# Patient Record
Sex: Male | Born: 1955
Health system: Southern US, Community
[De-identification: ages and names within clinical notes are randomized; demographics above are authoritative.]

## PROBLEM LIST (undated history)

## (undated) ENCOUNTER — Emergency Department (HOSPITAL_BASED_OUTPATIENT_CLINIC_OR_DEPARTMENT_OTHER): Admission: EM | Payer: BC Managed Care – PPO | Source: Home / Self Care

## (undated) DIAGNOSIS — M1990 Unspecified osteoarthritis, unspecified site: Secondary | ICD-10-CM

## (undated) DIAGNOSIS — M869 Osteomyelitis, unspecified: Secondary | ICD-10-CM

## (undated) DIAGNOSIS — G14 Postpolio syndrome: Secondary | ICD-10-CM

## (undated) DIAGNOSIS — I10 Essential (primary) hypertension: Secondary | ICD-10-CM

## (undated) HISTORY — DX: Unspecified osteoarthritis, unspecified site: M19.90

## (undated) HISTORY — PX: WISDOM TOOTH EXTRACTION: SHX21

## (undated) HISTORY — DX: Postpolio syndrome: G14

## (undated) HISTORY — PX: BONE MARROW ASPIRATION: SHX1252

## (undated) HISTORY — DX: Osteomyelitis, unspecified: M86.9

## (undated) HISTORY — DX: Essential (primary) hypertension: I10

---
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
## 2000-12-17 ENCOUNTER — Emergency Department (HOSPITAL_COMMUNITY): Admission: EM | Admit: 2000-12-17 | Discharge: 2000-12-18 | Payer: Self-pay | Admitting: Emergency Medicine

## 2003-11-11 ENCOUNTER — Emergency Department (HOSPITAL_COMMUNITY): Admission: EM | Admit: 2003-11-11 | Discharge: 2003-11-11 | Payer: Self-pay | Admitting: Emergency Medicine

## 2005-05-04 ENCOUNTER — Emergency Department (HOSPITAL_COMMUNITY): Admission: EM | Admit: 2005-05-04 | Discharge: 2005-05-04 | Payer: Self-pay | Admitting: Emergency Medicine

## 2012-10-25 ENCOUNTER — Encounter: Payer: Self-pay | Admitting: Physician Assistant

## 2012-10-25 ENCOUNTER — Ambulatory Visit (INDEPENDENT_AMBULATORY_CARE_PROVIDER_SITE_OTHER): Payer: BC Managed Care – PPO | Admitting: Physician Assistant

## 2012-10-25 VITALS — BP 158/92 | HR 52 | Temp 98.4°F | Resp 16 | Ht 67.5 in | Wt 207.5 lb

## 2012-10-25 DIAGNOSIS — Z0000 Encounter for general adult medical examination without abnormal findings: Secondary | ICD-10-CM

## 2012-10-25 DIAGNOSIS — Z299 Encounter for prophylactic measures, unspecified: Secondary | ICD-10-CM | POA: Insufficient documentation

## 2012-10-25 DIAGNOSIS — B91 Sequelae of poliomyelitis: Secondary | ICD-10-CM

## 2012-10-25 DIAGNOSIS — I10 Essential (primary) hypertension: Secondary | ICD-10-CM

## 2012-10-25 DIAGNOSIS — G14 Postpolio syndrome: Secondary | ICD-10-CM | POA: Insufficient documentation

## 2012-10-25 MED ORDER — AMLODIPINE BESYLATE 5 MG PO TABS: 2.5000 mg | ORAL_TABLET | Freq: Every day | ORAL | Status: DC

## 2012-10-25 NOTE — Assessment & Plan Note (Signed)
Uncontrolled with max dose of Hyzaar.  DASH diet handout given.  Decrease caffeine and alcohol consumption.  Rx Amlodipine 2.5 mg.  Keep BP log.  Follow up in 2 weeks.

## 2012-10-25 NOTE — Progress Notes (Signed)
Patient ID: Mark Humphrey, male   DOB: 1955/05/12, 57 y.o.   MRN: [id number]  Patient presents to clinic today to establish care  Acute Concerns: No current concerns.  Chronic Issues: (1) Hypertension -- Patient endorses being on medication Hyzaar for 3 months. Denies chest pain, headache, lightheadedness, dizziness, syncope or palpitations.  Stress intensifies his blood pressure. Does endorse alcohol consumption, poor diet and lack of exercise.  Denies history of depression, but endorses history of mild, occasional anxiety.  (2) Anxiety -- occasional. Work-related.  Is able to control by removing himself from the situation.  Health Maintenance: Dental -- overdue. Vision -- overdue. Immunizations -- up-to-date; has had flu shot 2 weeks ago.  Colonoscopy -- 2007; denies abnormal findings.     Past Medical History  Diagnosis Date  . HTN (hypertension)   . Post-polio syndrome 1958    Right Leg  . Osteomyelitis of left leg   . Osteoarthritis     Right leg    No current outpatient prescriptions on file prior to visit.   No current facility-administered medications on file prior to visit.    No Known Allergies  Family History  Problem Relation Age of Onset  . Arthritis Father     Deceased  . Heart disease Mother     Deceased  . Hypertension Mother   . Lung cancer Mother   . Lung cancer Other     Aunts x3  . Healthy Sister     x1  . COPD Sister   . Healthy Brother     x2    History   Social History  . Marital Status: Single    Spouse Name: N/A    Number of Children: N/A  . Years of Education: N/A   Social History Main Topics  . Smoking status: Never Smoker   . Smokeless tobacco: Current User    Types: Chew  . Alcohol Use: 4.8 oz/week    8 Shots of liquor per week  . Drug Use: No  . Sexual Activity: None   Other Topics Concern  . None   Social History Narrative  . None   Review of Systems  Constitutional: Negative for fever and weight loss.  HENT:  Negative for ear pain, hearing loss and tinnitus.   Eyes: Negative for blurred vision, double vision and photophobia.  Respiratory: Negative for cough, shortness of breath and wheezing.   Cardiovascular: Negative for chest pain and palpitations.  Gastrointestinal: Positive for heartburn. Negative for nausea, vomiting, abdominal pain, diarrhea, constipation, blood in stool and melena.  Genitourinary: Negative for dysuria, urgency, frequency, hematuria and flank pain.  Neurological: Negative for dizziness, seizures, loss of consciousness and headaches.  Endo/Heme/Allergies: Negative for environmental allergies.  Psychiatric/Behavioral: Negative for depression. The patient is not nervous/anxious.    Filed Vitals:   10/25/12 1725 10/25/12 1744  BP: 158/96 158/92  Pulse: 52   Temp: 98.4 F (36.9 C)   TempSrc: Oral   Resp: 16   Height: 5' 7.5" (1.715 m)   Weight: 207 lb 8 oz (94.121 kg)   SpO2: 98%     Physical Exam  Vitals reviewed. Constitutional: He is oriented to person, place, and time and well-developed, well-nourished, and in no distress.  HENT:  Head: Normocephalic and atraumatic.  Right Ear: External ear normal.  Left Ear: External ear normal.  Nose: Nose normal.  Mouth/Throat: Oropharynx is clear and moist. No oropharyngeal exudate.  TM WNL bilaterally  Eyes: Conjunctivae and EOM are normal. Pupils are equal,  round, and reactive to light.  Neck: Neck supple. No thyromegaly present.  Cardiovascular: Normal rate, regular rhythm, normal heart sounds and intact distal pulses.   Pulmonary/Chest: Effort normal and breath sounds normal. No respiratory distress. He has no wheezes. He has no rales. He exhibits no tenderness.  Abdominal: Soft. Bowel sounds are normal. He exhibits no distension and no mass. There is no tenderness. There is no rebound and no guarding.  Musculoskeletal:  RLE immobilized with brace due to post-polio syndrome.  Lymphadenopathy:    He has no cervical  adenopathy.  Neurological: He is alert and oriented to person, place, and time. No cranial nerve deficit.  Skin: Skin is warm and dry. No rash noted.  Psychiatric: Affect normal.   No results found for this or any previous visit (from the past 2160 hour(s)).  Assessment/Plan: Visit for preventive health examination Will obtain records from previous PCP.  Patient already received flu shot 2 weeks ago.  Colonoscopy due in 2017.  Will hold off on labs until records are reviewed due to patient endorsing just having labs drawn by previous PCP  Hypertension Uncontrolled with max dose of Hyzaar.  DASH diet handout given.  Decrease caffeine and alcohol consumption.  Rx Amlodipine 2.5 mg.  Keep BP log.  Follow up in 2 weeks.

## 2012-10-25 NOTE — Patient Instructions (Signed)
Please take medications as prescribed.  Check BP daily and write down to bring to next visit.  I would like to see you in 2 weeks.  Please read information below on DASH diet.  DASH Diet The DASH diet stands for "Dietary Approaches to Stop Hypertension." It is a healthy eating plan that has been shown to reduce high blood pressure (hypertension) in as little as 14 days, while also possibly providing other significant health benefits. These other health benefits include reducing the risk of breast cancer after menopause and reducing the risk of type 2 diabetes, heart disease, colon cancer, and stroke. Health benefits also include weight loss and slowing kidney failure in patients with chronic kidney disease.  DIET GUIDELINES  Limit salt (sodium). Your diet should contain less than 1500 mg of sodium daily.  Limit refined or processed carbohydrates. Your diet should include mostly whole grains. Desserts and added sugars should be used sparingly.  Include small amounts of heart-healthy fats. These types of fats include nuts, oils, and tub margarine. Limit saturated and trans fats. These fats have been shown to be harmful in the body. CHOOSING FOODS  The following food groups are based on a 2000 calorie diet. See your Registered Dietitian for individual calorie needs. Grains and Grain Products (6 to 8 servings daily)  Eat More Often: Whole-wheat bread, brown rice, whole-grain or wheat pasta, quinoa, popcorn without added fat or salt (air popped).  Eat Less Often: White bread, white pasta, white rice, cornbread. Vegetables (4 to 5 servings daily)  Eat More Often: Fresh, frozen, and canned vegetables. Vegetables may be raw, steamed, roasted, or grilled with a minimal amount of fat.  Eat Less Often/Avoid: Creamed or fried vegetables. Vegetables in a cheese sauce. Fruit (4 to 5 servings daily)  Eat More Often: All fresh, canned (in natural juice), or frozen fruits. Dried fruits without added sugar.  One hundred percent fruit juice ( cup [237 mL] daily).  Eat Less Often: Dried fruits with added sugar. Canned fruit in light or heavy syrup. [employer], Fish, and Poultry (2 servings or less daily. One serving is 3 to 4 oz [85-114 g]).  Eat More Often: Ninety percent or leaner ground beef, tenderloin, sirloin. Round cuts of beef, chicken breast, [place] breast. All fish. Grill, bake, or broil your meat. Nothing should be fried.  Eat Less Often/Avoid: Fatty cuts of meat, [place], or chicken leg, thigh, or wing. Fried cuts of meat or fish. Dairy (2 to 3 servings)  Eat More Often: Low-fat or fat-free milk, low-fat plain or light yogurt, reduced-fat or part-skim cheese.  Eat Less Often/Avoid: Milk (whole, 2%).Whole milk yogurt. Full-fat cheeses. Nuts, Seeds, and Legumes (4 to 5 servings per week)  Eat More Often: All without added salt.  Eat Less Often/Avoid: Salted nuts and seeds, canned beans with added salt. Fats and Sweets (limited)  Eat More Often: Vegetable oils, tub margarines without trans fats, sugar-free gelatin. Mayonnaise and salad dressings.  Eat Less Often/Avoid: Coconut oils, palm oils, butter, stick margarine, cream, half and half, cookies, candy, pie. FOR MORE INFORMATION The Dash Diet Eating Plan: www.dashdiet.org Document Released: 12/17/2010 Document Revised: 03/22/2011 Document Reviewed: 12/17/2010 [facility] Patient Information 2014 [place], [place].

## 2012-10-25 NOTE — Assessment & Plan Note (Signed)
Will obtain records from previous PCP.  Patient already received flu shot 2 weeks ago.  Colonoscopy due in 2017.  Will hold off on labs until records are reviewed due to patient endorsing just having labs drawn by previous PCP

## 2012-11-08 ENCOUNTER — Ambulatory Visit (INDEPENDENT_AMBULATORY_CARE_PROVIDER_SITE_OTHER): Payer: BC Managed Care – PPO | Admitting: Physician Assistant

## 2012-11-08 ENCOUNTER — Encounter: Payer: Self-pay | Admitting: Physician Assistant

## 2012-11-08 VITALS — BP 160/78 | HR 64 | Temp 98.7°F | Resp 16 | Ht 67.5 in | Wt 206.0 lb

## 2012-11-08 DIAGNOSIS — I10 Essential (primary) hypertension: Secondary | ICD-10-CM

## 2012-11-08 DIAGNOSIS — I340 Nonrheumatic mitral (valve) insufficiency: Secondary | ICD-10-CM | POA: Insufficient documentation

## 2012-11-08 DIAGNOSIS — R011 Cardiac murmur, unspecified: Secondary | ICD-10-CM

## 2012-11-08 NOTE — Assessment & Plan Note (Addendum)
Increase Norvasc to 5 mg (1 tablet) daily.  Continue with lifestyle changes and BP monitoring.  EKG obtained revealing Sinus Rhythm at rate of 60 bpm.  Follow-up in 2 weeks.

## 2012-11-08 NOTE — Assessment & Plan Note (Signed)
Grade II/VI systolic murmur heard best at RUSB.  Will obtain echocardiogram.  Scheduled for 1 week from now.  Will call patient with results.

## 2012-11-08 NOTE — Progress Notes (Signed)
Patient ID: Mark Humphrey, male   DOB: 16-Nov-1955, 57 y.o.   MRN: [id number]  Patient presents to clinic today for follow-up of hypertension.  At last visit patient was started on amlodipine 2.5 mg daily.  Patient was already on Hyzaar 100-25 mg tablet daily.  Discusses lifestyle changes including DASH diet, weight loss, exercise and decrease in alcohol consumption.  Patient endorses taking medication as prescribed.  Patient has been checking BP daily and has brought his machine to clinic today for BP measurement review.  Morning BP before medications are still within stage 2 hypertensive range.  However afternoon BP measurements are in the 130-140/80s.  Patient states he has started to improve his diet.  Still denies chest pain, palpitations, LH, dizziness, syncope or change in vision.  No EKG recorded in chart.     Past Medical History  Diagnosis Date  . HTN (hypertension)   . Post-polio syndrome 1958    Right Leg  . Osteomyelitis of left leg   . Osteoarthritis     Right leg    Current Outpatient Prescriptions on File Prior to Visit  Medication Sig Dispense Refill  . amLODipine (NORVASC) 5 MG tablet Take 0.5 tablets (2.5 mg total) by mouth daily.  90 tablet  3  . losartan-hydrochlorothiazide (HYZAAR) 100-25 MG per tablet Take 1 tablet by mouth daily.       No current facility-administered medications on file prior to visit.    No Known Allergies  Family History  Problem Relation Age of Onset  . Arthritis Father     Deceased  . Heart disease Mother     Deceased  . Hypertension Mother   . Lung cancer Mother   . Lung cancer Other     Aunts x3  . Healthy Sister     x1  . COPD Sister   . Healthy Brother     x2    History   Social History  . Marital Status: Single    Spouse Name: N/A    Number of Children: N/A  . Years of Education: N/A   Social History Main Topics  . Smoking status: Never Smoker   . Smokeless tobacco: Current User    Types: Chew  . Alcohol Use: 4.8  oz/week    8 Shots of liquor per week  . Drug Use: No  . Sexual Activity: None   Other Topics Concern  . None   Social History Narrative  . None   ROS See HPI.  All other ROS are negative.  Filed Vitals:   11/08/12 0722  BP: 160/78  Pulse: 64  Temp: 98.7 F (37.1 C)  Resp: 16    Physical Exam  Vitals reviewed. Constitutional: He is oriented to person, place, and time and well-developed, well-nourished, and in no distress.  HENT:  Head: Normocephalic and atraumatic.  Eyes: Conjunctivae are normal. Pupils are equal, round, and reactive to light.  Neck: Neck supple.  Cardiovascular: Normal rate, regular rhythm and intact distal pulses.   Murmur heard.  Systolic murmur is present with a grade of 2/6  Pulmonary/Chest: Effort normal and breath sounds normal. No respiratory distress. He has no wheezes. He has no rales. He exhibits no tenderness.  Lymphadenopathy:    He has no cervical adenopathy.  Neurological: He is alert and oriented to person, place, and time. No cranial nerve deficit.  Skin: Skin is warm and dry. No rash noted.  Psychiatric: Affect normal.   Assessment/Plan: Hypertension Increase Norvasc to 5 mg (1  tablet) daily.  Continue with lifestyle changes and BP monitoring.  EKG obtained revealing Sinus Rhythm at rate of 60 bpm.  Follow-up in 2 weeks.  Undiagnosed cardiac murmurs Grade II/VI systolic murmur heard best at RUSB.  Will obtain echocardiogram.  Scheduled for 1 week from now.  Will call patient with results.

## 2012-11-08 NOTE — Patient Instructions (Signed)
Increase your amlodipine (Norvasc) to 1 tablet a day.  Continue to keep implementing changes to your diet.  Continue monitoring BP at home.  I want to see you in 2 weeks  (preferably afternoon appointment).  You will be contacted by the imaging department for a date/time for your echocardiogram.  I will call you with all of your results.

## 2012-11-15 ENCOUNTER — Ambulatory Visit (HOSPITAL_BASED_OUTPATIENT_CLINIC_OR_DEPARTMENT_OTHER): Payer: BC Managed Care – PPO

## 2012-11-16 ENCOUNTER — Encounter: Payer: Self-pay | Admitting: *Deleted

## 2012-11-16 ENCOUNTER — Ambulatory Visit: Payer: BC Managed Care – PPO | Admitting: Physician Assistant

## 2012-11-20 ENCOUNTER — Ambulatory Visit (HOSPITAL_COMMUNITY)
Admission: RE | Admit: 2012-11-20 | Discharge: 2012-11-20 | Disposition: A | Discharge status: Home / Self Care | Payer: BC Managed Care – PPO | Source: Ambulatory Visit | Attending: Physician Assistant | Admitting: Physician Assistant

## 2012-11-20 DIAGNOSIS — I10 Essential (primary) hypertension: Secondary | ICD-10-CM | POA: Insufficient documentation

## 2012-11-20 DIAGNOSIS — I059 Rheumatic mitral valve disease, unspecified: Secondary | ICD-10-CM | POA: Insufficient documentation

## 2012-11-20 DIAGNOSIS — R011 Cardiac murmur, unspecified: Secondary | ICD-10-CM

## 2012-11-20 NOTE — Progress Notes (Signed)
Echocardiogram 2D Echocardiogram has been performed.  Mark Humphrey 11/20/2012, 11:46 AM 

## 2012-11-21 ENCOUNTER — Ambulatory Visit (HOSPITAL_COMMUNITY): Payer: BC Managed Care – PPO

## 2012-11-22 ENCOUNTER — Ambulatory Visit (INDEPENDENT_AMBULATORY_CARE_PROVIDER_SITE_OTHER): Payer: BC Managed Care – PPO | Admitting: Physician Assistant

## 2012-11-22 ENCOUNTER — Encounter: Payer: Self-pay | Admitting: Physician Assistant

## 2012-11-22 VITALS — BP 132/96 | HR 60 | Temp 98.9°F | Ht 67.5 in | Wt 204.0 lb

## 2012-11-22 DIAGNOSIS — I059 Rheumatic mitral valve disease, unspecified: Secondary | ICD-10-CM

## 2012-11-22 DIAGNOSIS — I10 Essential (primary) hypertension: Secondary | ICD-10-CM

## 2012-11-22 DIAGNOSIS — I519 Heart disease, unspecified: Secondary | ICD-10-CM

## 2012-11-22 DIAGNOSIS — I340 Nonrheumatic mitral (valve) insufficiency: Secondary | ICD-10-CM

## 2012-11-22 MED ORDER — LOSARTAN POTASSIUM-HCTZ 100-25 MG PO TABS: 1.0000 | ORAL_TABLET | Freq: Every day | ORAL | Status: DC

## 2012-11-22 NOTE — Assessment & Plan Note (Signed)
Discussed possibility of increasing amlodipine to 10 mg QD.  Patient wishes to stay at 5 mg and try a real attempt at dietary and lifestyle changes.  Given improvement in systolic BP and mild improvement in diastolic BP, I am amenable of a 43-month trial of TLCs.  Information on DASH diet and exercise/weight loss options given to and discussed with patient.

## 2012-11-22 NOTE — Assessment & Plan Note (Signed)
Echo 11/2012.  Patient to continue current anti-hypertensive medications.

## 2012-11-22 NOTE — Patient Instructions (Signed)
Please continue medications.  Read information below of lifestyle changes for blood pressure.  Follow-up in 1 month.  DASH Diet The DASH diet stands for "Dietary Approaches to Stop Hypertension." It is a healthy eating plan that has been shown to reduce high blood pressure (hypertension) in as little as 14 days, while also possibly providing other significant health benefits. These other health benefits include reducing the risk of breast cancer after menopause and reducing the risk of type 2 diabetes, heart disease, colon cancer, and stroke. Health benefits also include weight loss and slowing kidney failure in patients with chronic kidney disease.  DIET GUIDELINES  Limit salt (sodium). Your diet should contain less than 1500 mg of sodium daily.  Limit refined or processed carbohydrates. Your diet should include mostly whole grains. Desserts and added sugars should be used sparingly.  Include small amounts of heart-healthy fats. These types of fats include nuts, oils, and tub margarine. Limit saturated and trans fats. These fats have been shown to be harmful in the body. CHOOSING FOODS  The following food groups are based on a 2000 calorie diet. See your Registered Dietitian for individual calorie needs. Grains and Grain Products (6 to 8 servings daily)  Eat More Often: Whole-wheat bread, brown rice, whole-grain or wheat pasta, quinoa, popcorn without added fat or salt (air popped).  Eat Less Often: White bread, white pasta, white rice, cornbread. Vegetables (4 to 5 servings daily)  Eat More Often: Fresh, frozen, and canned vegetables. Vegetables may be raw, steamed, roasted, or grilled with a minimal amount of fat.  Eat Less Often/Avoid: Creamed or fried vegetables. Vegetables in a cheese sauce. Fruit (4 to 5 servings daily)  Eat More Often: All fresh, canned (in natural juice), or frozen fruits. Dried fruits without added sugar. One hundred percent fruit juice ( cup [237 mL]  daily).  Eat Less Often: Dried fruits with added sugar. Canned fruit in light or heavy syrup. [employer], Fish, and Poultry (2 servings or less daily. One serving is 3 to 4 oz [85-114 g]).  Eat More Often: Ninety percent or leaner ground beef, tenderloin, sirloin. Round cuts of beef, chicken breast, [place] breast. All fish. Grill, bake, or broil your meat. Nothing should be fried.  Eat Less Often/Avoid: Fatty cuts of meat, [place], or chicken leg, thigh, or wing. Fried cuts of meat or fish. Dairy (2 to 3 servings)  Eat More Often: Low-fat or fat-free milk, low-fat plain or light yogurt, reduced-fat or part-skim cheese.  Eat Less Often/Avoid: Milk (whole, 2%).Whole milk yogurt. Full-fat cheeses. Nuts, Seeds, and Legumes (4 to 5 servings per week)  Eat More Often: All without added salt.  Eat Less Often/Avoid: Salted nuts and seeds, canned beans with added salt. Fats and Sweets (limited)  Eat More Often: Vegetable oils, tub margarines without trans fats, sugar-free gelatin. Mayonnaise and salad dressings.  Eat Less Often/Avoid: Coconut oils, palm oils, butter, stick margarine, cream, half and half, cookies, candy, pie. FOR MORE INFORMATION The Dash Diet Eating Plan: www.dashdiet.org Document Released: 12/17/2010 Document Revised: 03/22/2011 Document Reviewed: 12/17/2010 [facility] Patient Information 2014 [place], [place].  Exercise to Lose Weight Exercise and a healthy diet may help you lose weight. Your doctor may suggest specific exercises. EXERCISE IDEAS AND TIPS  Choose low-cost things you enjoy doing, such as walking, bicycling, or exercising to workout videos.  Take stairs instead of the elevator.  Walk during your lunch break.  Park your car further away from work or school.  Go to a gym  or an exercise class.  Start with 5 to 10 minutes of exercise each day. Build up to 30 minutes of exercise 4 to 6 days a week.  Wear shoes with good support and comfortable  clothes.  Stretch before and after working out.  Work out until you breathe harder and your heart beats faster.  Drink extra water when you exercise.  Do not do so much that you hurt yourself, feel dizzy, or get very short of breath. Exercises that burn about 150 calories:  Running 1  miles in 15 minutes.  Playing volleyball for 45 to 60 minutes.  Washing and waxing a car for 45 to 60 minutes.  Playing touch football for 45 minutes.  Walking 1  miles in 35 minutes.  Pushing a stroller 1  miles in 30 minutes.  Playing basketball for 30 minutes.  Raking leaves for 30 minutes.  Bicycling 5 miles in 30 minutes.  Walking 2 miles in 30 minutes.  Dancing for 30 minutes.  Shoveling snow for 15 minutes.  Swimming laps for 20 minutes.  Walking up stairs for 15 minutes.  Bicycling 4 miles in 15 minutes.  Gardening for 30 to 45 minutes.  Jumping rope for 15 minutes.  Washing windows or floors for 45 to 60 minutes. Document Released: 01/30/2010 Document Revised: 03/22/2011 Document Reviewed: 01/30/2010 [facility] Patient Information 2014 [place], [place].

## 2012-11-22 NOTE — Progress Notes (Signed)
Pre visit review using our clinic review tool, if applicable. No additional management support is needed unless otherwise documented below in the visit note. 

## 2012-11-22 NOTE — Assessment & Plan Note (Signed)
Diagnosed via Echocardiogram 11/2012.  No further workup needed at present.  BP is being controlled with medications

## 2012-11-22 NOTE — Progress Notes (Signed)
Patient ID: Mark Humphrey, male   DOB: [birth year], 57 y.o.   MRN: [id number]  Patient presents to clinic today for follow-up of HTN and to discuss Echocardiogram results.  Patients BP is improved since increase of amlodipine to 5 mg qd.  Patient still taking Hyzaar as prescribed.   Denies chest pain, palpitations, shortness of breath, LH or dizziness.  Discussed results of echocardiogram with patient.  Murmur is coming from mild mitral regurgitation.  No systolic dysfunction.  Grade I diastolic dysfunction noted.  Patient already on appropriate therapy.   Past Medical History  Diagnosis Date  . HTN (hypertension)   . Post-polio syndrome 1958    Right Leg  . Osteomyelitis of left leg   . Osteoarthritis     Right leg    Current Outpatient Prescriptions on File Prior to Visit  Medication Sig Dispense Refill  . amLODipine (NORVASC) 5 MG tablet Take 0.5 tablets (2.5 mg total) by mouth daily.  90 tablet  3   No current facility-administered medications on file prior to visit.    No Known Allergies  Family History  Problem Relation Age of Onset  . Arthritis Father     Deceased  . Heart disease Mother     Deceased  . Hypertension Mother   . Lung cancer Mother   . Lung cancer Other     Aunts x3  . Healthy Sister     x1  . COPD Sister   . Healthy Brother     x2    History   Social History  . Marital Status: Single    Spouse Name: N/A    Number of Children: N/A  . Years of Education: N/A   Social History Main Topics  . Smoking status: Never Smoker   . Smokeless tobacco: Current User    Types: Chew  . Alcohol Use: 4.8 oz/week    8 Shots of liquor per week  . Drug Use: No  . Sexual Activity: None   Other Topics Concern  . None   Social History Narrative  . None   ROS See HPI.  All other ROS are negative.   Filed Vitals:   11/22/12 1623  BP: 132/96  Pulse: 60  Temp: 98.9 F (37.2 C)   Physical Exam  Vitals reviewed. Constitutional: He is oriented to  person, place, and time and well-developed, well-nourished, and in no distress.  HENT:  Head: Normocephalic and atraumatic.  Eyes: Conjunctivae are normal.  Neck: Neck supple.  Cardiovascular: Normal rate, regular rhythm, normal heart sounds and intact distal pulses.   Pulmonary/Chest: Effort normal and breath sounds normal. No respiratory distress. He has no wheezes. He has no rales. He exhibits no tenderness.  Neurological: He is alert and oriented to person, place, and time.  Skin: Skin is warm and dry. No rash noted.   No results found for this or any previous visit (from the past 2160 hour(s)).  Assessment/Plan: Mild diastolic dysfunction Echo 11/2012.  Patient to continue current anti-hypertensive medications.  Mild mitral regurgitation Diagnosed via Echocardiogram 11/2012.  No further workup needed at present.  BP is being controlled with medications  Hypertension Discussed possibility of increasing amlodipine to 10 mg QD.  Patient wishes to stay at 5 mg and try a real attempt at dietary and lifestyle changes.  Given improvement in systolic BP and mild improvement in diastolic BP, I am amenable of a 46-month trial of TLCs.  Information on DASH diet and exercise/weight loss options given to and  discussed with patient.

## 2012-12-25 ENCOUNTER — Encounter: Payer: Self-pay | Admitting: Physician Assistant

## 2012-12-25 ENCOUNTER — Ambulatory Visit (INDEPENDENT_AMBULATORY_CARE_PROVIDER_SITE_OTHER): Payer: BC Managed Care – PPO | Admitting: Physician Assistant

## 2012-12-25 VITALS — BP 138/88 | HR 72 | Temp 98.7°F | Resp 16 | Wt 207.0 lb

## 2012-12-25 DIAGNOSIS — I10 Essential (primary) hypertension: Secondary | ICD-10-CM

## 2012-12-25 NOTE — Progress Notes (Signed)
Patient ID: Mark Humphrey, male   DOB: 1955/12/21, 57 y.o.   MRN: [id number]  Patient presents to clinic today for 8-month follow-up for hypertension.  Patient's BP today at 138/88, mildly improved diastolic blood pressure when compared to last visit.  Patient has been checking BP at home and has continued to get  DBP in the 90s.  Denies chest pain, palpitations, LH, vision changes or syncope.   Past Medical History  Diagnosis Date  . HTN (hypertension)   . Post-polio syndrome 1958    Right Leg  . Osteomyelitis of left leg   . Osteoarthritis     Right leg    Current Outpatient Prescriptions on File Prior to Visit  Medication Sig Dispense Refill  . amLODipine (NORVASC) 5 MG tablet Take 0.5 tablets (2.5 mg total) by mouth daily.  90 tablet  3  . losartan-hydrochlorothiazide (HYZAAR) 100-25 MG per tablet Take 1 tablet by mouth daily.  30 tablet  3   No current facility-administered medications on file prior to visit.    No Known Allergies  Family History  Problem Relation Age of Onset  . Arthritis Father     Deceased  . Heart disease Mother     Deceased  . Hypertension Mother   . Lung cancer Mother   . Lung cancer Other     Aunts x3  . Healthy Sister     x1  . COPD Sister   . Healthy Brother     x2    History   Social History  . Marital Status: Single    Spouse Name: N/A    Number of Children: N/A  . Years of Education: N/A   Social History Main Topics  . Smoking status: Never Smoker   . Smokeless tobacco: Current User    Types: Chew  . Alcohol Use: 4.8 oz/week    8 Shots of liquor per week  . Drug Use: No  . Sexual Activity: None   Other Topics Concern  . None   Social History Narrative  . None   Review of Systems - See HPI.  All other ROS are negative.  Filed Vitals:   12/25/12 0715  BP: 138/88  Pulse: 72  Temp: 98.7 F (37.1 C)  Resp: 16   Physical Exam  Vitals reviewed. Constitutional: He is oriented to person, place, and time and  well-developed, well-nourished, and in no distress.  HENT:  Head: Normocephalic and atraumatic.  Eyes: Conjunctivae and EOM are normal.  Neck: Neck supple.  Cardiovascular: Normal rate, regular rhythm and normal heart sounds.   Pulmonary/Chest: Effort normal and breath sounds normal. No respiratory distress. He has no wheezes. He has no rales. He exhibits no tenderness.  Lymphadenopathy:    He has no cervical adenopathy.  Neurological: He is alert and oriented to person, place, and time.  Skin: Skin is warm and dry.  Psychiatric: Affect normal.   Assessment/Plan: Hypertension Increase amlodipine to 10 mg daily.  Continue DASH diet and BP monitoring.  Return to clinic in 2 weeks.

## 2012-12-25 NOTE — Patient Instructions (Signed)
Please increase Norvasc to 2 tablets a day (10 mg).  Continue to monitor BP daily.  Please return to clinic in 2 weeks.  Continue with DASH diet.  Hypertension As your heart beats, it forces blood through your arteries. This force is your blood pressure. If the pressure is too high, it is called hypertension (HTN) or high blood pressure. HTN is dangerous because you may have it and not know it. High blood pressure may mean that your heart has to work harder to pump blood. Your arteries may be narrow or stiff. The extra work puts you at risk for heart disease, stroke, and other problems.  Blood pressure consists of two numbers, a higher number over a lower, 110/72, for example. It is stated as "110 over 72." The ideal is below 120 for the top number (systolic) and under 80 for the bottom (diastolic). Write down your blood pressure today. You should pay close attention to your blood pressure if you have certain conditions such as:  Heart failure.  Prior heart attack.  Diabetes  Chronic kidney disease.  Prior stroke.  Multiple risk factors for heart disease. To see if you have HTN, your blood pressure should be measured while you are seated with your arm held at the level of the heart. It should be measured at least twice. A one-time elevated blood pressure reading (especially in the Emergency Department) does not mean that you need treatment. There may be conditions in which the blood pressure is different between your right and left arms. It is important to see your caregiver soon for a recheck. Most people have essential hypertension which means that there is not a specific cause. This type of high blood pressure may be lowered by changing lifestyle factors such as:  Stress.  Smoking.  Lack of exercise.  Excessive weight.  Drug/tobacco/alcohol use.  Eating less salt. Most people do not have symptoms from high blood pressure until it has caused damage to the body. Effective treatment  can often prevent, delay or reduce that damage. TREATMENT  When a cause has been identified, treatment for high blood pressure is directed at the cause. There are a large number of medications to treat HTN. These fall into several categories, and your caregiver will help you select the medicines that are best for you. Medications may have side effects. You should review side effects with your caregiver. If your blood pressure stays high after you have made lifestyle changes or started on medicines,   Your medication(s) may need to be changed.  Other problems may need to be addressed.  Be certain you understand your prescriptions, and know how and when to take your medicine.  Be sure to follow up with your caregiver within the time frame advised (usually within two weeks) to have your blood pressure rechecked and to review your medications.  If you are taking more than one medicine to lower your blood pressure, make sure you know how and at what times they should be taken. Taking two medicines at the same time can result in blood pressure that is too low. SEEK IMMEDIATE MEDICAL CARE IF:  You develop a severe headache, blurred or changing vision, or confusion.  You have unusual weakness or numbness, or a faint feeling.  You have severe chest or abdominal pain, vomiting, or breathing problems. MAKE SURE YOU:   Understand these instructions.  Will watch your condition.  Will get help right away if you are not doing well or get worse. Document  Released: 12/28/2004 Document Revised: 03/22/2011 Document Reviewed: 08/18/2007 [facility] Patient Information 2014 [place], [place].

## 2012-12-25 NOTE — Assessment & Plan Note (Signed)
Increase amlodipine to 10 mg daily.  Continue DASH diet and BP monitoring.  Return to clinic in 2 weeks.

## 2013-01-08 ENCOUNTER — Ambulatory Visit (INDEPENDENT_AMBULATORY_CARE_PROVIDER_SITE_OTHER): Payer: BC Managed Care – PPO | Admitting: Physician Assistant

## 2013-01-08 ENCOUNTER — Encounter: Payer: Self-pay | Admitting: Physician Assistant

## 2013-01-08 VITALS — BP 154/84 | HR 62 | Temp 98.1°F | Resp 16 | Ht 67.5 in | Wt 206.5 lb

## 2013-01-08 DIAGNOSIS — F411 Generalized anxiety disorder: Secondary | ICD-10-CM

## 2013-01-08 DIAGNOSIS — F419 Anxiety disorder, unspecified: Secondary | ICD-10-CM

## 2013-01-08 DIAGNOSIS — I10 Essential (primary) hypertension: Secondary | ICD-10-CM

## 2013-01-08 MED ORDER — DIAZEPAM 5 MG PO TABS: 5.0000 mg | ORAL_TABLET | Freq: Two times a day (BID) | ORAL | Status: DC | PRN

## 2013-01-08 NOTE — Progress Notes (Signed)
Pre visit review using our clinic review tool, if applicable. No additional management support is needed unless otherwise documented below in the visit note/SLS  

## 2013-01-09 DIAGNOSIS — F419 Anxiety disorder, unspecified: Secondary | ICD-10-CM | POA: Insufficient documentation

## 2013-01-09 NOTE — Assessment & Plan Note (Signed)
Continue current regimen. Continue diet and exercise. Will add Valium for anxiety. Reassess BP in one month.

## 2013-01-09 NOTE — Progress Notes (Signed)
Patient ID: Mark Humphrey, male   DOB: [birth year], 57 y.o.   MRN: [id number]  Patient presents to clinic today for followup of hypertension. Patient currently on Hyzaar and amlodipine. Patient continues to deny chest pain, palpitations, vision changes, lightheadedness or dizziness. BP 154/84 in clinic. Patient does have history of work-related anxiety. I do feel this is contributing to his elevated blood pressure. Patient denies depression. Does endorse anxiety that has been present over the past couple of months, as he has been dealing with some difficult situations at work. Is affecting his sleep. Denies suicidal thought or ideation.  Past Medical History  Diagnosis Date  . HTN (hypertension)   . Post-polio syndrome 1958    Right Leg  . Osteomyelitis of left leg   . Osteoarthritis     Right leg    Current Outpatient Prescriptions on File Prior to Visit  Medication Sig Dispense Refill  . amLODipine (NORVASC) 5 MG tablet Take 10 mg by mouth daily.      Marland Kitchen losartan-hydrochlorothiazide (HYZAAR) 100-25 MG per tablet Take 1 tablet by mouth daily.  30 tablet  3   No current facility-administered medications on file prior to visit.    No Known Allergies  Family History  Problem Relation Age of Onset  . Arthritis Father     Deceased  . Heart disease Mother     Deceased  . Hypertension Mother   . Lung cancer Mother   . Lung cancer Other     Aunts x3  . Healthy Sister     x1  . COPD Sister   . Healthy Brother     x2    History   Social History  . Marital Status: Single    Spouse Name: N/A    Number of Children: N/A  . Years of Education: N/A   Social History Main Topics  . Smoking status: Never Smoker   . Smokeless tobacco: Current User    Types: Chew  . Alcohol Use: 4.8 oz/week    8 Shots of liquor per week  . Drug Use: No  . Sexual Activity: None   Other Topics Concern  . None   Social History Narrative  . None   Review of Systems - See HPI.  All other ROS are  negative.  Filed Vitals:   01/08/13 0829  BP: 154/84  Pulse: 62  Temp: 98.1 F (36.7 C)  Resp: 16   Physical Exam  Vitals reviewed. Constitutional: He is oriented to person, place, and time and well-developed, well-nourished, and in no distress.  HENT:  Head: Normocephalic and atraumatic.  Eyes: Conjunctivae are normal. Pupils are equal, round, and reactive to light.  Neck: Neck supple.  Cardiovascular: Normal rate, regular rhythm, normal heart sounds and intact distal pulses.   Pulmonary/Chest: Effort normal and breath sounds normal. No respiratory distress. He has no wheezes. He has no rales. He exhibits no tenderness.  Neurological: He is alert and oriented to person, place, and time. No cranial nerve deficit.  Skin: Skin is warm and dry. No rash noted.  Psychiatric:  Anxious   Assessment/Plan: Hypertension Continue current regimen. Continue diet and exercise. Will add Valium for anxiety. Reassess BP in one month.  Anxiety Rx Valium. Follow up in one month.

## 2013-01-09 NOTE — Assessment & Plan Note (Signed)
Rx Valium. Follow up in one month.

## 2013-01-25 ENCOUNTER — Ambulatory Visit (INDEPENDENT_AMBULATORY_CARE_PROVIDER_SITE_OTHER): Payer: BC Managed Care – PPO | Admitting: Physician Assistant

## 2013-01-25 ENCOUNTER — Encounter: Payer: Self-pay | Admitting: Physician Assistant

## 2013-01-25 VITALS — BP 142/74 | HR 63 | Temp 98.3°F | Resp 16 | Ht 67.5 in | Wt 201.5 lb

## 2013-01-25 DIAGNOSIS — F329 Major depressive disorder, single episode, unspecified: Secondary | ICD-10-CM | POA: Insufficient documentation

## 2013-01-25 DIAGNOSIS — F341 Dysthymic disorder: Secondary | ICD-10-CM

## 2013-01-25 DIAGNOSIS — F410 Panic disorder [episodic paroxysmal anxiety] without agoraphobia: Secondary | ICD-10-CM

## 2013-01-25 DIAGNOSIS — F32A Depression, unspecified: Secondary | ICD-10-CM | POA: Insufficient documentation

## 2013-01-25 DIAGNOSIS — F419 Anxiety disorder, unspecified: Principal | ICD-10-CM

## 2013-01-25 MED ORDER — ESCITALOPRAM OXALATE 10 MG PO TABS: 10.0000 mg | ORAL_TABLET | Freq: Every day | ORAL | Status: DC

## 2013-01-25 NOTE — Assessment & Plan Note (Signed)
Symptoms seem consistent with panic attack.  Do not seem cardiac in nature, especially with recent normal EKG and controlled BP.  Patient instructed to begin taking Valium as prescribed.  Rx for Lexapro given.  Patient to proceed to the ER if "red flag" symptoms occur.  Follow-up in 1 month or sooner if needed.

## 2013-01-25 NOTE — Progress Notes (Signed)
Pre visit review using our clinic review tool, if applicable. No additional management support is needed unless otherwise documented below in the visit note/SLS  

## 2013-01-25 NOTE — Patient Instructions (Signed)
Please take medications as prescribed -- The Valium can be taken up to twice daily if needed.  For the lexapro, please take 1/2 tablet a day for 7 days, then increase to 1 tablet daily (10 mg). Please return to clinic for labs.  Follow-up in 1 month.  Return sooner if needed.  Generalized Anxiety Disorder Generalized anxiety disorder (GAD) is a mental disorder. It interferes with life functions, including relationships, work, and school. GAD is different from normal anxiety, which everyone experiences at some point in their lives in response to specific life events and activities. Normal anxiety actually helps us prepare for and get through these life events and activities. Normal anxiety goes away after the event or activity is over.  GAD causes anxiety that is not necessarily related to specific events or activities. It also causes excess anxiety in proportion to specific events or activities. The anxiety associated with GAD is also difficult to control. GAD can vary from mild to severe. People with severe GAD can have intense waves of anxiety with physical symptoms (panic attacks).  SYMPTOMS The anxiety and worry associated with GAD are difficult to control. This anxiety and worry are related to many life events and activities and also occur more days than not for 6 months or longer. People with GAD also have three or more of the following symptoms (one or more in children):  Restlessness.   Fatigue.  Difficulty concentrating.   Irritability.  Muscle tension.  Difficulty sleeping or unsatisfying sleep. DIAGNOSIS GAD is diagnosed through an assessment by your caregiver. Your caregiver will ask you questions aboutyour mood,physical symptoms, and events in your life. Your caregiver may ask you about your medical history and use of alcohol or drugs, including prescription medications. Your caregiver may also do a physical exam and blood tests. Certain medical conditions and the use of certain  substances can cause symptoms similar to those associated with GAD. Your caregiver may refer you to a mental health specialist for further evaluation. TREATMENT The following therapies are usually used to treat GAD:   Medication Antidepressant medication usually is prescribed for long-term daily control. Antianxiety medications may be added in severe cases, especially when panic attacks occur.   Talk therapy (psychotherapy) Certain types of talk therapy can be helpful in treating GAD by providing support, education, and guidance. A form of talk therapy called cognitive behavioral therapy can teach you healthy ways to think about and react to daily life events and activities.  Stress managementtechniques These include yoga, meditation, and exercise and can be very helpful when they are practiced regularly. A mental health specialist can help determine which treatment is best for you. Some people see improvement with one therapy. However, other people require a combination of therapies. Document Released: 04/24/2012 Document Reviewed: 04/24/2012 [facility] Patient Information 2014 [place].

## 2013-01-25 NOTE — Progress Notes (Signed)
Patient presents to clinic today c/o episode of anxiety, followed by heart racing and chest tightness that lasted for a couple of minutes.  Symptoms occurred on Tuesday. Patient denies chest pain or shortness of breath.  Denies LH or dizziness.  Denies jaw pain or pain radiating down left arm.  Patient has recent EKG revealing NSR. Had recent echocardiogram due to undiagnosed cardiac murmur.  Mild diastolic dysfunction noted.  Patient on appropriate medications.  BP under control at present.  Patient endorses some tingling in his hands during the episode.  Episode started while he was having a heated argument with a supervisor at work.  Patient does have history of anxiety, mostly work-related, for which he was given a prescription for Valium.  Patient has not had prescription filled.  Patient endorses increased stress at work.  Was not able to go in to work today due to stress level.  Patient does not have documented history of Depression, but endorses depressed mood most days of the week.  Denies anhedonia.  Does have occasional difficulty with memory and concentration.  Denies suicidal thought or ideation.  Is interested in medication at this point, as well as seeing a counselor to help cope with his anxiety levels.  Past Medical History  Diagnosis Date  . HTN (hypertension)   . Post-polio syndrome 1958    Right Leg  . Osteomyelitis of left leg   . Osteoarthritis     Right leg    Current Outpatient Prescriptions on File Prior to Visit  Medication Sig Dispense Refill  . amLODipine (NORVASC) 5 MG tablet Take 10 mg by mouth daily.      Marland Kitchen losartan-hydrochlorothiazide (HYZAAR) 100-25 MG per tablet Take 1 tablet by mouth daily.  30 tablet  3  . diazepam (VALIUM) 5 MG tablet Take 1 tablet (5 mg total) by mouth every 12 (twelve) hours as needed for anxiety.  45 tablet  0   No current facility-administered medications on file prior to visit.    No Known Allergies  Family History  Problem Relation  Age of Onset  . Arthritis Father     Deceased  . Heart disease Mother     Deceased  . Hypertension Mother   . Lung cancer Mother   . Lung cancer Other     Aunts x3  . Healthy Sister     x1  . COPD Sister   . Healthy Brother     x2    History   Social History  . Marital Status: Single    Spouse Name: N/A    Number of Children: N/A  . Years of Education: N/A   Social History Main Topics  . Smoking status: Never Smoker   . Smokeless tobacco: Current User    Types: Chew  . Alcohol Use: 4.8 oz/week    8 Shots of liquor per week  . Drug Use: No  . Sexual Activity: None   Other Topics Concern  . None   Social History Narrative  . None   Review of Systems - See HPI.  All other ROS are negative.  Filed Vitals:   01/25/13 1602  BP: 142/74  Pulse: 63  Temp: 98.3 F (36.8 C)  Resp: 16   Physical Exam  Vitals reviewed. Constitutional: He is oriented to person, place, and time and well-developed, well-nourished, and in no distress.  HENT:  Head: Normocephalic and atraumatic.  Eyes: Conjunctivae and EOM are normal. Pupils are equal, round, and reactive to light.  Neck:  Neck supple. No thyromegaly present.  Cardiovascular: Normal rate, regular rhythm and intact distal pulses.   Murmur heard. Pulmonary/Chest: Effort normal and breath sounds normal. No respiratory distress. He has no wheezes. He has no rales. He exhibits no tenderness.  Neurological: He is alert and oriented to person, place, and time.  Skin: Skin is warm and dry. No rash noted.  Psychiatric:  anxious   Assessment/Plan: Anxiety and depression Will check BMP and TSH. Rx Lexapro 5mg  x 7 days, increasing to 10mg  daily. Patient instructed to fill prescription for Valium to be taken up to twice daily as needed.  Patient given handout on [facility] with number to call for appointment.  Patient educated on alarm signs/symptoms and when to proceed to ER if necessary.  Follow-up in 1  month.  Panic attack Symptoms seem consistent with panic attack.  Do not seem cardiac in nature, especially with recent normal EKG and controlled BP.  Patient instructed to begin taking Valium as prescribed.  Rx for Lexapro given.  Patient to proceed to the ER if "red flag" symptoms occur.  Follow-up in 1 month or sooner if needed.

## 2013-01-25 NOTE — Assessment & Plan Note (Addendum)
Will check BMP and TSH. Rx Lexapro 5mg  x 7 days, increasing to 10mg  daily. Patient instructed to fill prescription for Valium to be taken up to twice daily as needed.  Patient given handout on [facility] with number to call for appointment.  Patient educated on alarm signs/symptoms and when to proceed to ER if necessary.  Follow-up in 1 month.

## 2013-01-26 ENCOUNTER — Telehealth: Payer: Self-pay | Admitting: Physician Assistant

## 2013-01-26 NOTE — Telephone Encounter (Signed)
Relevant patient education assigned to patient using Emmi. ° °

## 2013-02-02 ENCOUNTER — Ambulatory Visit (INDEPENDENT_AMBULATORY_CARE_PROVIDER_SITE_OTHER): Payer: BC Managed Care – PPO | Admitting: Physician Assistant

## 2013-02-02 ENCOUNTER — Encounter: Payer: Self-pay | Admitting: Physician Assistant

## 2013-02-02 VITALS — BP 138/76 | HR 67 | Temp 98.2°F | Wt 203.4 lb

## 2013-02-02 DIAGNOSIS — F419 Anxiety disorder, unspecified: Principal | ICD-10-CM

## 2013-02-02 DIAGNOSIS — F329 Major depressive disorder, single episode, unspecified: Secondary | ICD-10-CM

## 2013-02-02 DIAGNOSIS — F32A Depression, unspecified: Secondary | ICD-10-CM

## 2013-02-02 DIAGNOSIS — F341 Dysthymic disorder: Secondary | ICD-10-CM

## 2013-02-02 NOTE — Progress Notes (Signed)
Pre-visit discussion using our clinic review tool. No additional management support is needed unless otherwise documented below in the visit note.  

## 2013-02-04 NOTE — Progress Notes (Signed)
Patient presents to clinic today to discuss recent visit with psychologist. Patient with history of significant anxiety stemming from increased stressors at work. Patient is currently on Lexapro 10 mg daily and Valium 5 mg twice a day if needed for anxiety. Patient has been tolerating medications well. At last visit, we decided to set patient up with psychologist. Patient endorses having that appointment earlier today. States the psychologist did not want to talk about his issues. Patient states admission told him to just take his medication. Patient is upset about this, because he really wanted to talk through his issues in order to develop coping mechanisms for his anxiety. Patient is requesting referral to a different therapist.  Past Medical History  Diagnosis Date  . HTN (hypertension)   . Post-polio syndrome 1958    Right Leg  . Osteomyelitis of left leg   . Osteoarthritis     Right leg    Current Outpatient Prescriptions on File Prior to Visit  Medication Sig Dispense Refill  . amLODipine (NORVASC) 5 MG tablet Take 10 mg by mouth daily.      . diazepam (VALIUM) 5 MG tablet Take 1 tablet (5 mg total) by mouth every 12 (twelve) hours as needed for anxiety.  45 tablet  0  . escitalopram (LEXAPRO) 10 MG tablet Take 1 tablet (10 mg total) by mouth daily.  30 tablet  1  . losartan-hydrochlorothiazide (HYZAAR) 100-25 MG per tablet Take 1 tablet by mouth daily.  30 tablet  3   No current facility-administered medications on file prior to visit.    No Known Allergies  Family History  Problem Relation Age of Onset  . Arthritis Father     Deceased  . Heart disease Mother     Deceased  . Hypertension Mother   . Lung cancer Mother   . Lung cancer Other     Aunts x3  . Healthy Sister     x1  . COPD Sister   . Healthy Brother     x2    History   Social History  . Marital Status: Single    Spouse Name: N/A    Number of Children: N/A  . Years of Education: N/A   Social History  Main Topics  . Smoking status: Never Smoker   . Smokeless tobacco: Current User    Types: Chew  . Alcohol Use: 4.8 oz/week    8 Shots of liquor per week  . Drug Use: No  . Sexual Activity: None   Other Topics Concern  . None   Social History Narrative  . None    Review of Systems - See HPI.  All other ROS are negative.  Filed Vitals:   02/02/13 1631  BP: 138/76  Pulse: 67  Temp: 98.2 F (36.8 C)    Physical Exam  Constitutional: He is oriented to person, place, and time and well-developed, well-nourished, and in no distress.  HENT:  Head: Normocephalic and atraumatic.  Cardiovascular: Normal rate and regular rhythm.   Neurological: He is alert and oriented to person, place, and time.  Skin: Skin is warm and dry.  Psychiatric: Affect normal.    No results found for this or any previous visit (from the past 2160 hour(s)).  Assessment/Plan: No problem-specific assessment & plan notes found for this encounter.   +

## 2013-02-04 NOTE — Assessment & Plan Note (Signed)
Discussed with patient that the psychologist he went to is obviously not the best fit for him. Patient given handout on low-power counselors and psychologist. Instructed patient to call the number provided to set up an appointment. Patient to return to clinic as needed. Otherwise we'll follow up in one to 2 months.

## 2013-02-05 ENCOUNTER — Telehealth: Payer: Self-pay | Admitting: Physician Assistant

## 2013-02-05 NOTE — Telephone Encounter (Signed)
Relevant patient education assigned to patient using Emmi. ° °

## 2013-03-01 ENCOUNTER — Ambulatory Visit: Payer: BC Managed Care – PPO | Admitting: Physician Assistant

## 2013-05-02 ENCOUNTER — Ambulatory Visit (INDEPENDENT_AMBULATORY_CARE_PROVIDER_SITE_OTHER): Payer: BC Managed Care – PPO | Admitting: Physician Assistant

## 2013-05-02 ENCOUNTER — Telehealth: Payer: Self-pay | Admitting: Physician Assistant

## 2013-05-02 ENCOUNTER — Encounter: Payer: Self-pay | Admitting: Physician Assistant

## 2013-05-02 VITALS — BP 154/88 | HR 61 | Temp 98.5°F | Resp 16 | Ht 67.5 in | Wt 199.0 lb

## 2013-05-02 DIAGNOSIS — I10 Essential (primary) hypertension: Secondary | ICD-10-CM

## 2013-05-02 DIAGNOSIS — F341 Dysthymic disorder: Secondary | ICD-10-CM

## 2013-05-02 DIAGNOSIS — F329 Major depressive disorder, single episode, unspecified: Secondary | ICD-10-CM

## 2013-05-02 DIAGNOSIS — F419 Anxiety disorder, unspecified: Secondary | ICD-10-CM

## 2013-05-02 DIAGNOSIS — Z299 Encounter for prophylactic measures, unspecified: Secondary | ICD-10-CM

## 2013-05-02 MED ORDER — LOSARTAN POTASSIUM-HCTZ 100-25 MG PO TABS: 1.0000 | ORAL_TABLET | Freq: Every day | ORAL | Status: DC

## 2013-05-02 NOTE — Patient Instructions (Signed)
Please restart the losartan-HCTZ daily.  We will hold off on the amlodipine for now.  I am glad your stress levels are musch improved.  Please return in 2 weeks for fasting labs.  Schedule an appointment for a complete physical sometime in the next 2-3 weeks.  We will review your lab results at that visit.  Hypertension As your heart beats, it forces blood through your arteries. This force is your blood pressure. If the pressure is too high, it is called hypertension (HTN) or high blood pressure. HTN is dangerous because you may have it and not know it. High blood pressure may mean that your heart has to work harder to pump blood. Your arteries may be narrow or stiff. The extra work puts you at risk for heart disease, stroke, and other problems.  Blood pressure consists of two numbers, a higher number over a lower, 110/72, for example. It is stated as "110 over 72." The ideal is below 120 for the top number (systolic) and under 80 for the bottom (diastolic). Write down your blood pressure today. You should pay close attention to your blood pressure if you have certain conditions such as:  Heart failure.  Prior heart attack.  Diabetes  Chronic kidney disease.  Prior stroke.  Multiple risk factors for heart disease. To see if you have HTN, your blood pressure should be measured while you are seated with your arm held at the level of the heart. It should be measured at least twice. A one-time elevated blood pressure reading (especially in the Emergency Department) does not mean that you need treatment. There may be conditions in which the blood pressure is different between your right and left arms. It is important to see your caregiver soon for a recheck. Most people have essential hypertension which means that there is not a specific cause. This type of high blood pressure may be lowered by changing lifestyle factors such as:  Stress.  Smoking.  Lack of exercise.  Excessive  weight.  Drug/tobacco/alcohol use.  Eating less salt. Most people do not have symptoms from high blood pressure until it has caused damage to the body. Effective treatment can often prevent, delay or reduce that damage. TREATMENT  When a cause has been identified, treatment for high blood pressure is directed at the cause. There are a large number of medications to treat HTN. These fall into several categories, and your caregiver will help you select the medicines that are best for you. Medications may have side effects. You should review side effects with your caregiver. If your blood pressure stays high after you have made lifestyle changes or started on medicines,   Your medication(s) may need to be changed.  Other problems may need to be addressed.  Be certain you understand your prescriptions, and know how and when to take your medicine.  Be sure to follow up with your caregiver within the time frame advised (usually within two weeks) to have your blood pressure rechecked and to review your medications.  If you are taking more than one medicine to lower your blood pressure, make sure you know how and at what times they should be taken. Taking two medicines at the same time can result in blood pressure that is too low. SEEK IMMEDIATE MEDICAL CARE IF:  You develop a severe headache, blurred or changing vision, or confusion.  You have unusual weakness or numbness, or a faint feeling.  You have severe chest or abdominal pain, vomiting, or breathing problems.  MAKE SURE YOU:   Understand these instructions.  Will watch your condition.  Will get help right away if you are not doing well or get worse. Document Released: 12/28/2004 Document Revised: 03/22/2011 Document Reviewed: 08/18/2007 [facility] Patient Information 2014 [place], [place].

## 2013-05-02 NOTE — Telephone Encounter (Signed)
Relevant patient education assigned to patient using Emmi. ° °

## 2013-05-02 NOTE — Progress Notes (Signed)
Pre visit review using our clinic review tool, if applicable. No additional management support is needed unless otherwise documented below in the visit note/SLS  

## 2013-05-02 NOTE — Assessment & Plan Note (Signed)
Lab orders placed to have completed before upcoming CPE.

## 2013-05-02 NOTE — Assessment & Plan Note (Signed)
Patient off of medications.  Feels elevated BP was due to anxiety.  BP still elevated.  Asymptomatic. Patient with very mild, asymptomatic diastolic dysfunction.  Will restart losartan-HCTZ.  Follow-up in 3-4 weeks for BP check and CPE with fasting labs.

## 2013-05-02 NOTE — Assessment & Plan Note (Signed)
Resolved after recent retirement.  Doing well off of medication.  Will continue to monitor.

## 2013-05-02 NOTE — Progress Notes (Signed)
Patient presents to clinic today for BP recheck. Patient has been out of his medication for several weeks.  Denies chest pain, palpitations, SOB, lightheadedness or dizziness.  Patient has retired from work which was a significant source of his stress/anxiety.  Has stopped his Lexapro and Valium.  Denies depressed mood or anhedonia.  Denies panic attack or increased anxiety.  Has stopped drinking.  States he is doing well.  Past Medical History  Diagnosis Date  . HTN (hypertension)   . Post-polio syndrome 1958    Right Leg  . Osteomyelitis of left leg   . Osteoarthritis     Right leg    No current outpatient prescriptions on file prior to visit.   No current facility-administered medications on file prior to visit.    No Known Allergies  Family History  Problem Relation Age of Onset  . Arthritis Father     Deceased  . Heart disease Mother     Deceased  . Hypertension Mother   . Lung cancer Mother   . Lung cancer Other     Aunts x3  . Healthy Sister     x1  . COPD Sister   . Healthy Brother     x2    History   Social History  . Marital Status: Single    Spouse Name: N/A    Number of Children: N/A  . Years of Education: N/A   Social History Main Topics  . Smoking status: Never Smoker   . Smokeless tobacco: Current User    Types: Chew  . Alcohol Use: 4.8 oz/week    8 Shots of liquor per week  . Drug Use: No  . Sexual Activity: None   Other Topics Concern  . None   Social History Narrative  . None   Review of Systems - See HPI.  All other ROS are negative.  BP 154/88  Pulse 61  Temp(Src) 98.5 F (36.9 C) (Oral)  Resp 16  Ht 5' 7.5" (1.715 m)  Wt 199 lb (90.266 kg)  BMI 30.69 kg/m2  SpO2 98%  Physical Exam  Vitals reviewed. Constitutional: He is oriented to person, place, and time and well-developed, well-nourished, and in no distress.  HENT:  Head: Normocephalic and atraumatic.  Eyes: Conjunctivae are normal. Pupils are equal, round, and  reactive to light.  Neck: Neck supple.  Cardiovascular: Normal rate, regular rhythm, normal heart sounds and intact distal pulses.   Pulmonary/Chest: Breath sounds normal. No respiratory distress. He has no wheezes. He has no rales. He exhibits no tenderness.  Neurological: He is alert and oriented to person, place, and time.  Skin: Skin is warm and dry. No rash noted.  Psychiatric: Affect normal.   Assessment/Plan: Anxiety and depression Resolved after recent retirement.  Doing well off of medication.  Will continue to monitor.  Hypertension Patient off of medications.  Feels elevated BP was due to anxiety.  BP still elevated.  Asymptomatic. Patient with very mild, asymptomatic diastolic dysfunction.  Will restart losartan-HCTZ.  Follow-up in 3-4 weeks for BP check and CPE with fasting labs.    Preventive measure Lab orders placed to have completed before upcoming CPE.

## 2013-06-07 ENCOUNTER — Encounter: Payer: Self-pay | Admitting: Physician Assistant

## 2013-06-07 ENCOUNTER — Ambulatory Visit (INDEPENDENT_AMBULATORY_CARE_PROVIDER_SITE_OTHER): Payer: BC Managed Care – PPO | Admitting: Physician Assistant

## 2013-06-07 VITALS — BP 140/70 | HR 63 | Temp 99.0°F | Resp 16 | Ht 67.5 in | Wt 190.8 lb

## 2013-06-07 DIAGNOSIS — F32A Depression, unspecified: Secondary | ICD-10-CM

## 2013-06-07 DIAGNOSIS — I10 Essential (primary) hypertension: Secondary | ICD-10-CM

## 2013-06-07 DIAGNOSIS — F419 Anxiety disorder, unspecified: Secondary | ICD-10-CM

## 2013-06-07 DIAGNOSIS — F341 Dysthymic disorder: Secondary | ICD-10-CM

## 2013-06-07 DIAGNOSIS — F329 Major depressive disorder, single episode, unspecified: Secondary | ICD-10-CM

## 2013-06-07 NOTE — Progress Notes (Signed)
Pre visit review using our clinic review tool, if applicable. No additional management support is needed unless otherwise documented below in the visit note/SLS  

## 2013-06-07 NOTE — Patient Instructions (Signed)
Please continue blood pressure medications.  I am glad you are doing better.  Please bring in your labs results from the Orthopedist.  If you cannot get these results, you need to come back in fasting for blood work.  The orders are already in the system. You do not need an appointment for the lab.   Hypertension Hypertension is another name for high blood pressure. High blood pressure may mean that your heart needs to work harder to pump blood. Blood pressure consists of two numbers, which includes a higher number over a lower number (example: 110/72). HOME CARE   Make lifestyle changes as told by your doctor. This may include weight loss and exercise.  Take your blood pressure medicine every day.  Limit how much salt you use.  Stop smoking if you smoke.  Do not use drugs.  Talk to your doctor if you are using decongestants or birth control pills. These medicines might make blood pressure higher.  Females should not drink more than 1 alcoholic drink per day. Males should not drink more than 2 alcoholic drinks per day.  See your doctor as told. GET HELP RIGHT AWAY IF:   You have a blood pressure reading with a top number of 180 or higher.  You get a very bad headache.  You get blurred or changing vision.  You feel confused.  You feel weak, numb, or faint.  You get chest or belly (abdominal) pain.  You throw up (vomit).  You cannot breathe very well. MAKE SURE YOU:   Understand these instructions.  Will watch your condition.  Will get help right away if you are not doing well or get worse. Document Released: 06/16/2007 Document Revised: 03/22/2011 Document Reviewed: 06/16/2007 [facility] Patient Information 2014 [place], [place].

## 2013-06-07 NOTE — Assessment & Plan Note (Signed)
Stable off meds.  Asymptomatic at present.  Patient is staying busy after retirement. 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

## 2013-06-07 NOTE — Assessment & Plan Note (Signed)
Well-controlled. Asymptomatic. Continue current medications.  Labs are in the system.  Patient to return to clinic for labs or bring labs from his specialist.

## 2013-06-07 NOTE — Progress Notes (Signed)
Patient presents to clinic today for follow-up of anxiety and hypertension.  Patient is now enjoying retirement.  Patient states BP has been well-controlled at home.  Denies headache, chest pain, vision changes, palpitations, lightheadedness or dizziness. Endorses taking medication as directed.  BP 140/70 in clinic.  Still without anxiety since retiring from his stressful job. Denies SI/HI. Denies depressed mood or anhedonia.   Past Medical History  Diagnosis Date  . HTN (hypertension)   . Post-polio syndrome 1958    Right Leg  . Osteomyelitis of left leg   . Osteoarthritis     Right leg    Current Outpatient Prescriptions on File Prior to Visit  Medication Sig Dispense Refill  . ibuprofen (ADVIL,MOTRIN) 200 MG tablet Take 200 mg by mouth every 6 (six) hours as needed.      Marland Kitchen losartan-hydrochlorothiazide (HYZAAR) 100-25 MG per tablet Take 1 tablet by mouth daily.  30 tablet  3   No current facility-administered medications on file prior to visit.   No Known Allergies  Family History  Problem Relation Age of Onset  . Arthritis Father     Deceased  . Heart disease Mother     Deceased  . Hypertension Mother   . Lung cancer Mother   . Lung cancer Other     Aunts x3  . Healthy Sister     x1  . COPD Sister   . Healthy Brother     x2    History   Social History  . Marital Status: Single    Spouse Name: N/A    Number of Children: N/A  . Years of Education: N/A   Social History Main Topics  . Smoking status: Never Smoker   . Smokeless tobacco: Current User    Types: Chew  . Alcohol Use: 4.8 oz/week    8 Shots of liquor per week  . Drug Use: No  . Sexual Activity: None   Other Topics Concern  . None   Social History Narrative  . None   Review of Systems - See HPI.  All other ROS are negative.  BP 140/70  Pulse 63  Temp(Src) 99 F (37.2 C) (Oral)  Resp 16  Ht 5' 7.5" (1.715 m)  Wt 190 lb 12 oz (86.524 kg)  BMI 29.42 kg/m2  SpO2 99%  Physical Exam  Vitals  reviewed. Constitutional: He is oriented to person, place, and time and well-developed, well-nourished, and in no distress.  HENT:  Head: Normocephalic and atraumatic.  Eyes: Conjunctivae are normal. Pupils are equal, round, and reactive to light.  Cardiovascular: Normal rate, regular rhythm, normal heart sounds and intact distal pulses.   Pulmonary/Chest: Effort normal and breath sounds normal. No respiratory distress. He has no wheezes. He has no rales. He exhibits no tenderness.  Neurological: He is alert and oriented to person, place, and time.  Skin: Skin is warm and dry. No rash noted.  Psychiatric: Mood, memory, affect and judgment normal.   Assessment/Plan: Hypertension Well-controlled. Asymptomatic. Continue current medications.  Labs are in the system.  Patient to return to clinic for labs or bring labs from his specialist.   Anxiety and depression Stable off meds.  Asymptomatic at present.  Patient is staying busy after retirement. 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

## 2013-06-25 ENCOUNTER — Other Ambulatory Visit: Payer: Self-pay | Admitting: Physician Assistant

## 2013-06-25 NOTE — Telephone Encounter (Signed)
Rx request to pharmacy/SLS  

## 2013-07-10 ENCOUNTER — Telehealth: Payer: Self-pay | Admitting: Physician Assistant

## 2013-07-10 NOTE — Telephone Encounter (Signed)
Patient would like to know if we "drain knees" here??

## 2013-07-11 NOTE — Telephone Encounter (Signed)
DUPLICATE NOTES. 

## 2013-07-11 NOTE — Telephone Encounter (Signed)
Patient informed, understood & agreed/SLS  

## 2013-07-11 NOTE — Telephone Encounter (Signed)
If he cannot get in with them, he needs to be seen so we can evaluate his knee.

## 2013-07-11 NOTE — Telephone Encounter (Signed)
No.  He should see his orthopedist for that.

## 2013-07-11 NOTE — Telephone Encounter (Signed)
Please Advise

## 2013-09-03 ENCOUNTER — Ambulatory Visit (INDEPENDENT_AMBULATORY_CARE_PROVIDER_SITE_OTHER): Payer: BC Managed Care – PPO | Admitting: Nurse Practitioner

## 2013-09-03 ENCOUNTER — Telehealth: Payer: Self-pay | Admitting: Physician Assistant

## 2013-09-03 ENCOUNTER — Encounter: Payer: Self-pay | Admitting: Nurse Practitioner

## 2013-09-03 VITALS — BP 132/88 | HR 57 | Temp 98.5°F | Resp 16 | Ht 67.5 in | Wt 197.0 lb

## 2013-09-03 DIAGNOSIS — M25469 Effusion, unspecified knee: Secondary | ICD-10-CM

## 2013-09-03 DIAGNOSIS — M25462 Effusion, left knee: Secondary | ICD-10-CM

## 2013-09-03 NOTE — Progress Notes (Signed)
Pre visit review using our clinic review tool, if applicable. No additional management support is needed unless otherwise documented below in the visit note/SLS  

## 2013-09-03 NOTE — Telephone Encounter (Signed)
Relevant patient education assigned to patient using Emmi. ° °

## 2013-09-03 NOTE — Patient Instructions (Signed)
You have inflammation in knee, given your history of recent increased activity, it is probably overuse injury. Rest, ice for 10 minutes 3 times daily, wear knee sleeve for support, apply aspercream 3 times daily. Continue meloxicam with food daily. If you have no improvement within 48 hrs, please see Dr Turner Daniels.   Your history of osteomyelitis is concerning. If this is infection, I would expect knee to become more swollen, painful, & hot within 24 hours. If this occurs, please see Dr Turner Daniels.  Nice to meet you.

## 2013-09-03 NOTE — Progress Notes (Signed)
   Subjective:    Patient ID: Mark Humphrey, male    DOB: [birth year], 58 y.o.   MRN: [id number]  HPI Comments: Pt has history of recurrent osteomyelitis in L femur. Last episode was 04/2013. He is treated by Dr Turner Daniels w/ [facility]. He started dicloxacillin 3 days ago- He had refill remaining from last script.    Knee Pain  The incident occurred 3 to 5 days ago. Incident location: playing more golf last week. The injury mechanism was a twisting injury. The pain is present in the left knee. The pain is moderate. The pain has been improving since onset. Pertinent negatives include no inability to bear weight, loss of motion, loss of sensation, muscle weakness, numbness or tingling. He reports no foreign bodies present. The symptoms are aggravated by weight bearing. He has tried rest, ice and NSAIDs for the symptoms. The treatment provided mild relief.      Review of Systems  Constitutional: Negative for fever, chills and fatigue.  Musculoskeletal: Positive for arthralgias (L knee pain) and gait problem.       Polio in R leg, uses brace.  Neurological: Negative for tingling and numbness.       Objective:   Physical Exam  Vitals reviewed. Constitutional: He is oriented to person, place, and time. He appears well-developed and well-nourished. No distress.  HENT:  Head: Normocephalic and atraumatic.  Well-healed Scar across nose  Eyes: Conjunctivae are normal. Right eye exhibits no discharge. Left eye exhibits no discharge.  Cardiovascular: Normal rate.   Pulmonary/Chest: Effort normal.  Musculoskeletal: He exhibits edema and tenderness.  Warm moderate effusion L medial knee. Not able to fully extend or flex.   Neurological: He is alert and oriented to person, place, and time.  Skin: Skin is warm and dry.  Psychiatric: He has a normal mood and affect. His behavior is normal. Thought content normal.          Assessment & Plan:  1. Knee effusion, left DD: recurrent  osteomyelitis, meniscal injury, overuse injury Did not preform joint aspiration due to Hx of recurrent osteomyeitis. Prefer pt see orthopedist. See pt instructions. F/u w/ ortho if no improvement.

## 2013-09-18 ENCOUNTER — Ambulatory Visit (INDEPENDENT_AMBULATORY_CARE_PROVIDER_SITE_OTHER): Payer: BC Managed Care – PPO | Admitting: Physician Assistant

## 2013-09-18 ENCOUNTER — Encounter: Payer: Self-pay | Admitting: Physician Assistant

## 2013-09-18 VITALS — BP 154/83 | HR 70 | Temp 98.3°F | Wt 193.0 lb

## 2013-09-18 DIAGNOSIS — M25569 Pain in unspecified knee: Secondary | ICD-10-CM

## 2013-09-18 DIAGNOSIS — M25562 Pain in left knee: Secondary | ICD-10-CM

## 2013-09-18 MED ORDER — MELOXICAM 15 MG PO TABS
15.0000 mg | ORAL_TABLET | Freq: Every day | ORAL | Status: DC
Start: 2013-09-18 — End: 2013-12-05

## 2013-09-18 NOTE — Progress Notes (Signed)
Pre visit review using our clinic review tool, if applicable. No additional management support is needed unless otherwise documented below in the visit note. 

## 2013-09-18 NOTE — Patient Instructions (Signed)
Please take Mobic daily with food.  Continue to wear your knee brace.  Please have the MRI that your Orthopedist ordered today at your appointment.  Keep the knee elevated.  Call if symptoms worsen before you can get back in with the Orthopedist.

## 2013-09-18 NOTE — Progress Notes (Signed)
Patient presents to clinic today c/o left knee pain that has been present for several months but has worsened over the past couple of weeks.  Was evaluated by his Orthopedist earlier today and has an MRI scheduled to further assess.  Patient did not want to cancel his appointment.  Patient would like to discuss a refill of his Mobic for this left knee pain.  Denies other concern at today's visit.  Past Medical History  Diagnosis Date  . HTN (hypertension)   . Post-polio syndrome 1958    Right Leg  . Osteomyelitis of left leg   . Osteoarthritis     Right leg    Current Outpatient Prescriptions on File Prior to Visit  Medication Sig Dispense Refill  . dicloxacillin (DYNAPEN) 500 MG capsule Take 500 mg by mouth 4 (four) times daily.      Marland Kitchen ibuprofen (ADVIL,MOTRIN) 200 MG tablet Take 200 mg by mouth every 6 (six) hours as needed.      Marland Kitchen losartan-hydrochlorothiazide (HYZAAR) 100-25 MG per tablet TAKE 1 TABLET BY MOUTH EVERY DAY  90 tablet  0  . traMADol (ULTRAM) 50 MG tablet Take 50 mg by mouth as directed. Every 4-6 hours PRN pain       No current facility-administered medications on file prior to visit.    No Known Allergies  Family History  Problem Relation Age of Onset  . Arthritis Father     Deceased  . Heart disease Mother     Deceased  . Hypertension Mother   . Lung cancer Mother   . Lung cancer Other     Aunts x3  . Healthy Sister     x1  . COPD Sister   . Healthy Brother     x2    History   Social History  . Marital Status: Single    Spouse Name: N/A    Number of Children: N/A  . Years of Education: N/A   Social History Main Topics  . Smoking status: Never Smoker   . Smokeless tobacco: Current User    Types: Chew  . Alcohol Use: 4.8 oz/week    8 Shots of liquor per week  . Drug Use: No  . Sexual Activity: None   Other Topics Concern  . None   Social History Narrative  . None    Review of Systems - See HPI.  All other ROS are negative.  BP 154/83   Pulse 70  Temp(Src) 98.3 F (36.8 C)  Wt 193 lb (87.544 kg)  SpO2 99%  Physical Exam  Vitals reviewed. Constitutional: He is oriented to person, place, and time and well-developed, well-nourished, and in no distress.  HENT:  Head: Normocephalic and atraumatic.  Cardiovascular: Normal rate, regular rhythm, normal heart sounds and intact distal pulses.   Pulmonary/Chest: Effort normal and breath sounds normal. No respiratory distress. He has no wheezes. He has no rales. He exhibits no tenderness.  Musculoskeletal:       Left knee: He exhibits abnormal meniscus. He exhibits normal range of motion, no swelling, no effusion, no ecchymosis, no deformity and no bony tenderness. Tenderness found. Medial joint line tenderness noted. No lateral joint line and no patellar tendon tenderness noted.       Legs: Neurological: He is alert and oriented to person, place, and time.  Skin: Skin is warm and dry. No rash noted.    No results found for this or any previous visit (from the past 2160 hour(s)).  Assessment/Plan: Left knee  pain Already being assessed by Orthopedic Surgery.  MRI upcoming.  Suspect partial tear to medial meniscus.  RICE therapy.  Mobic refilled.

## 2013-09-18 NOTE — Assessment & Plan Note (Signed)
Already being assessed by Orthopedic Surgery.  MRI upcoming.  Suspect partial tear to medial meniscus.  RICE therapy.  Mobic refilled.

## 2013-09-25 ENCOUNTER — Emergency Department (HOSPITAL_BASED_OUTPATIENT_CLINIC_OR_DEPARTMENT_OTHER)
Admission: EM | Admit: 2013-09-25 | Discharge: 2013-09-25 | Discharge status: Against Medical Advice | Payer: BC Managed Care – PPO | Attending: Emergency Medicine | Admitting: Emergency Medicine

## 2013-09-25 ENCOUNTER — Encounter (HOSPITAL_BASED_OUTPATIENT_CLINIC_OR_DEPARTMENT_OTHER): Payer: Self-pay | Admitting: Emergency Medicine

## 2013-09-25 DIAGNOSIS — M25569 Pain in unspecified knee: Secondary | ICD-10-CM | POA: Insufficient documentation

## 2013-09-25 DIAGNOSIS — I10 Essential (primary) hypertension: Secondary | ICD-10-CM | POA: Insufficient documentation

## 2013-09-25 NOTE — ED Notes (Signed)
Pain in his left knee. States he was seen by his orthopedist today and they aspirated fluid and sent it for a culture for hx of osteomyelitis. After he got home his knee started swelling. He wants the fluid removed.

## 2013-09-27 ENCOUNTER — Telehealth: Payer: Self-pay | Admitting: Physician Assistant

## 2013-09-27 NOTE — Telephone Encounter (Signed)
Caller name: Codey Relation to pt: Call back number:[phone]   Reason for call:  Pt visited [facility] and wants to give his PCP an update.  Call back.

## 2013-09-27 NOTE — Telephone Encounter (Signed)
Patient has appointment on 9/18.  Will discuss then as I am out of office today on 09/27/13.

## 2013-09-28 ENCOUNTER — Encounter: Payer: Self-pay | Admitting: Physician Assistant

## 2013-09-28 ENCOUNTER — Ambulatory Visit (INDEPENDENT_AMBULATORY_CARE_PROVIDER_SITE_OTHER): Payer: BC Managed Care – PPO | Admitting: Physician Assistant

## 2013-09-28 ENCOUNTER — Telehealth: Payer: Self-pay | Admitting: Physician Assistant

## 2013-09-28 VITALS — BP 175/72 | HR 68 | Temp 98.6°F | Resp 16 | Ht 67.5 in | Wt 200.2 lb

## 2013-09-28 DIAGNOSIS — M10062 Idiopathic gout, left knee: Secondary | ICD-10-CM

## 2013-09-28 DIAGNOSIS — I10 Essential (primary) hypertension: Secondary | ICD-10-CM

## 2013-09-28 DIAGNOSIS — M109 Gout, unspecified: Secondary | ICD-10-CM

## 2013-09-28 MED ORDER — TRAMADOL HCL 50 MG PO TABS: 50.0000 mg | ORAL_TABLET | ORAL | Status: DC

## 2013-09-28 MED ORDER — METHYLPREDNISOLONE (PAK) 4 MG PO TABS: ORAL_TABLET | ORAL | Status: DC

## 2013-09-28 MED ORDER — LOSARTAN POTASSIUM 100 MG PO TABS: 100.0000 mg | ORAL_TABLET | Freq: Every day | ORAL | Status: DC

## 2013-09-28 MED ORDER — AMLODIPINE BESYLATE 5 MG PO TABS: 5.0000 mg | ORAL_TABLET | Freq: Every day | ORAL | Status: DC

## 2013-09-28 NOTE — Patient Instructions (Signed)
STOP the Naproxen.  Start taking the Medrol steroid pack as directed.  Tramadol for severe pain.  Ice the knee a few times per day. Keep it elevated. You can resume the naproxen after finishing steroid pack.  I think there is a chance the HCTZ could be contributing.  I want you to stop your current blood pressure medication and begin taking the new medications I have sent in as directed -- Losartan 100 mg daily and Amlodipine 5 mg daily.  Follow-up with me in 1-2 weeks.  Gout Gout is an inflammatory arthritis caused by a buildup of uric acid crystals in the joints. Uric acid is a chemical that is normally present in the blood. When the level of uric acid in the blood is too high it can form crystals that deposit in your joints and tissues. This causes joint redness, soreness, and swelling (inflammation). Repeat attacks are common. Over time, uric acid crystals can form into masses (tophi) near a joint, destroying bone and causing disfigurement. Gout is treatable and often preventable. CAUSES  The disease begins with elevated levels of uric acid in the blood. Uric acid is produced by your body when it breaks down a naturally found substance called purines. Certain foods you eat, such as meats and fish, contain high amounts of purines. Causes of an elevated uric acid level include:  Being passed down from parent to child (heredity).  Diseases that cause increased uric acid production (such as obesity, psoriasis, and certain cancers).  Excessive alcohol use.  Diet, especially diets rich in meat and seafood.  Medicines, including certain cancer-fighting medicines (chemotherapy), water pills (diuretics), and aspirin.  Chronic kidney disease. The kidneys are no longer able to remove uric acid well.  Problems with metabolism. Conditions strongly associated with gout include:  Obesity.  High blood pressure.  High cholesterol.  Diabetes. Not everyone with elevated uric acid levels gets gout. It  is not understood why some people get gout and others do not. Surgery, joint injury, and eating too much of certain foods are some of the factors that can lead to gout attacks. SYMPTOMS   An attack of gout comes on quickly. It causes intense pain with redness, swelling, and warmth in a joint.  Fever can occur.  Often, only one joint is involved. Certain joints are more commonly involved:  Base of the big toe.  Knee.  Ankle.  Wrist.  Finger. Without treatment, an attack usually goes away in a few days to weeks. Between attacks, you usually will not have symptoms, which is different from many other forms of arthritis. DIAGNOSIS  Your caregiver will suspect gout based on your symptoms and exam. In some cases, tests may be recommended. The tests may include:  Blood tests.  Urine tests.  X-rays.  Joint fluid exam. This exam requires a needle to remove fluid from the joint (arthrocentesis). Using a microscope, gout is confirmed when uric acid crystals are seen in the joint fluid. TREATMENT  There are two phases to gout treatment: treating the sudden onset (acute) attack and preventing attacks (prophylaxis).  Treatment of an Acute Attack.  Medicines are used. These include anti-inflammatory medicines or steroid medicines.  An injection of steroid medicine into the affected joint is sometimes necessary.  The painful joint is rested. Movement can worsen the arthritis.  You may use warm or cold treatments on painful joints, depending which works best for you.  Treatment to Prevent Attacks.  If you suffer from frequent gout attacks, your caregiver  may advise preventive medicine. These medicines are started after the acute attack subsides. These medicines either help your kidneys eliminate uric acid from your body or decrease your uric acid production. You may need to stay on these medicines for a very long time.  The early phase of treatment with preventive medicine can be  associated with an increase in acute gout attacks. For this reason, during the first few months of treatment, your caregiver may also advise you to take medicines usually used for acute gout treatment. Be sure you understand your caregiver's directions. Your caregiver may make several adjustments to your medicine dose before these medicines are effective.  Discuss dietary treatment with your caregiver or dietitian. Alcohol and drinks high in sugar and fructose and foods such as meat, poultry, and seafood can increase uric acid levels. Your caregiver or dietitian can advise you on drinks and foods that should be limited. HOME CARE INSTRUCTIONS   Do not take aspirin to relieve pain. This raises uric acid levels.  Only take over-the-counter or prescription medicines for pain, discomfort, or fever as directed by your caregiver.  Rest the joint as much as possible. When in bed, keep sheets and blankets off painful areas.  Keep the affected joint raised (elevated).  Apply warm or cold treatments to painful joints. Use of warm or cold treatments depends on which works best for you.  Use crutches if the painful joint is in your leg.  Drink enough fluids to keep your urine clear or pale yellow. This helps your body get rid of uric acid. Limit alcohol, sugary drinks, and fructose drinks.  Follow your dietary instructions. Pay careful attention to the amount of protein you eat. Your daily diet should emphasize fruits, vegetables, whole grains, and fat-free or low-fat milk products. Discuss the use of coffee, vitamin C, and cherries with your caregiver or dietitian. These may be helpful in lowering uric acid levels.  Maintain a healthy body weight. SEEK MEDICAL CARE IF:   You develop diarrhea, vomiting, or any side effects from medicines.  You do not feel better in 24 hours, or you are getting worse. SEEK IMMEDIATE MEDICAL CARE IF:   Your joint becomes suddenly more tender, and you have chills or a  fever. MAKE SURE YOU:   Understand these instructions.  Will watch your condition.  Will get help right away if you are not doing well or get worse. Document Released: 12/26/1999 Document Revised: 05/14/2013 Document Reviewed: 08/11/2011 [facility] Patient Information 2015 [place], [place]. This information is not intended to replace advice given to you by your health care provider. Make sure you discuss any questions you have with your health care provider.

## 2013-09-28 NOTE — Telephone Encounter (Signed)
Medications sent to pharmacy. Please inform patient.  Thank you.

## 2013-09-28 NOTE — Telephone Encounter (Signed)
Caller name: Devon  Relation to pt: self  Call back number: [phone] [facility]: [facility] [address], [place], [place] [address] [phone]   Reason for call: pt current pharmacy [facility] is down pt would like you send losartan (COZAAR) 100 MG tablet, amLODipine (NORVASC) 5 MG tablet, methylPREDNIsolone (MEDROL DOSPACK) 4 MG tablet  Please send to [facility]

## 2013-09-28 NOTE — Progress Notes (Signed)
Patient presents to clinic today c/o gout attack of left knee x 1 week.  Was evaluated late last week by his Orthopedist and sent to [facility] for further assessment.  Patient endorses joint was aspirated and was related to gout attack.  States he was given 2 pills of colchicine and an Rx for Naproxen to take as directed.  Denies improvement with colchicine.  States Naproxen helps but only for a short period.  Denies fever, chills, malaise.  Denies decrease in ROM.  Does endorse warmth and swelling of left knee joint.  Past Medical History  Diagnosis Date  . HTN (hypertension)   . Post-polio syndrome 1958    Right Leg  . Osteomyelitis of left leg   . Osteoarthritis     Right leg    Current Outpatient Prescriptions on File Prior to Visit  Medication Sig Dispense Refill  . dicloxacillin (DYNAPEN) 500 MG capsule Take 500 mg by mouth 4 (four) times daily.      . meloxicam (MOBIC) 15 MG tablet Take 1 tablet (15 mg total) by mouth daily.  30 tablet  0   No current facility-administered medications on file prior to visit.    No Known Allergies  Family History  Problem Relation Age of Onset  . Arthritis Father     Deceased  . Heart disease Mother     Deceased  . Hypertension Mother   . Lung cancer Mother   . Lung cancer Other     Aunts x3  . Healthy Sister     x1  . COPD Sister   . Healthy Brother     x2    History   Social History  . Marital Status: Single    Spouse Name: N/A    Number of Children: N/A  . Years of Education: N/A   Social History Main Topics  . Smoking status: Never Smoker   . Smokeless tobacco: Current User    Types: Chew  . Alcohol Use: 4.8 oz/week    8 Shots of liquor per week  . Drug Use: No  . Sexual Activity: None   Other Topics Concern  . None   Social History Narrative  . None   Review of Systems - See HPI.  All other ROS are negative.  BP 175/72  Pulse 68  Temp(Src) 98.6 F (37 C) (Oral)  Resp 16  Ht 5' 7.5" (1.715 m)  Wt 200 lb 4  oz (90.833 kg)  BMI 30.88 kg/m2  SpO2 100%  Physical Exam  Vitals reviewed. Constitutional: He is oriented to person, place, and time and well-developed, well-nourished, and in no distress.  HENT:  Head: Normocephalic and atraumatic.  Eyes: Conjunctivae are normal.  Neck: Neck supple.  Cardiovascular: Normal rate, regular rhythm, normal heart sounds and intact distal pulses.   Pulmonary/Chest: Effort normal. No respiratory distress. He has no wheezes. He has no rales. He exhibits no tenderness.  Musculoskeletal:       Left knee: He exhibits swelling. He exhibits normal range of motion, no LCL laxity, normal patellar mobility, no bony tenderness, normal meniscus and no MCL laxity. Tenderness found. Medial joint line tenderness noted.  Neurological: He is alert and oriented to person, place, and time.  Skin: Skin is warm and dry. No rash noted. No erythema.  Psychiatric: Affect normal.   Assessment/Plan: Essential hypertension, benign Patient has stopped the Hyzaar due to gout per Orthopedist instructions.  Was not given BP medication alternative by specialist.  BP significantly elevated  in clinic. Asymptomatic.  Will Rx Losartan 100 mg and Amlodipine 5 mg daily.  Agree that the HCTZ in the Hyzaar could be a contributing factor.  Follow-up in 2 weeks for a BP recheck.  Acute idiopathic gout of left knee Rx Medrol dose pack.  Tramadol for severe pain. RICE therapy. Follow-up in 1-2 weeks.

## 2013-09-28 NOTE — Progress Notes (Signed)
Pre visit review using our clinic review tool, if applicable. No additional management support is needed unless otherwise documented below in the visit note/SLS  

## 2013-09-28 NOTE — Assessment & Plan Note (Signed)
Patient has stopped the Hyzaar due to gout per Orthopedist instructions.  Was not given BP medication alternative by specialist.  BP significantly elevated in clinic. Asymptomatic.  Will Rx Losartan 100 mg and Amlodipine 5 mg daily.  Agree that the HCTZ in the Hyzaar could be a contributing factor.  Follow-up in 2 weeks for a BP recheck.

## 2013-09-28 NOTE — Assessment & Plan Note (Signed)
Rx Medrol dose pack.  Tramadol for severe pain. RICE therapy. Follow-up in 1-2 weeks.

## 2013-10-09 ENCOUNTER — Ambulatory Visit: Payer: BC Managed Care – PPO | Admitting: Physician Assistant

## 2013-10-10 ENCOUNTER — Ambulatory Visit (INDEPENDENT_AMBULATORY_CARE_PROVIDER_SITE_OTHER): Payer: BC Managed Care – PPO | Admitting: Physician Assistant

## 2013-10-10 ENCOUNTER — Encounter: Payer: Self-pay | Admitting: Physician Assistant

## 2013-10-10 VITALS — BP 156/78 | HR 60 | Temp 98.8°F | Resp 16 | Ht 67.5 in | Wt 196.4 lb

## 2013-10-10 DIAGNOSIS — M25469 Effusion, unspecified knee: Secondary | ICD-10-CM

## 2013-10-10 DIAGNOSIS — M25462 Effusion, left knee: Secondary | ICD-10-CM

## 2013-10-10 DIAGNOSIS — I10 Essential (primary) hypertension: Secondary | ICD-10-CM

## 2013-10-10 MED ORDER — NAPROXEN 500 MG PO TABS: 500.0000 mg | ORAL_TABLET | Freq: Two times a day (BID) | ORAL | Status: DC

## 2013-10-10 NOTE — Addendum Note (Signed)
Addended by: Marcelline MatesMARTIN, Caress Reffitt on: 10/10/2013 07:51 AM   Modules accepted: Orders

## 2013-10-10 NOTE — Patient Instructions (Addendum)
Please continue medications as directed.  You may receive benefit from taking a Tylenol arthritis later in the day once the Mobic is wearing off.  Continue your knee brace.  Keep leg elevated while at home. Follow-up with your Orthopedist as scheduled.  For BP -- it is still too high, even on recheck.  Increase amlodipine to 10 mg (2 tablets) daily over the next 2 weeks. Follow-up with Jasmine DecemberSharon in 2 weeks for a BP recheck.  DASH Eating Plan DASH stands for "Dietary Approaches to Stop Hypertension." The DASH eating plan is a healthy eating plan that has been shown to reduce high blood pressure (hypertension). Additional health benefits may include reducing the risk of type 2 diabetes mellitus, heart disease, and stroke. The DASH eating plan may also help with weight loss. WHAT DO I NEED TO KNOW ABOUT THE DASH EATING PLAN? For the DASH eating plan, you will follow these general guidelines:  Choose foods with a percent daily value for sodium of less than 5% (as listed on the food label).  Use salt-free seasonings or herbs instead of table salt or sea salt.  Check with your health care provider or pharmacist before using salt substitutes.  Eat lower-sodium products, often labeled as "lower sodium" or "no salt added."  Eat fresh foods.  Eat more vegetables, fruits, and low-fat dairy products.  Choose whole grains. Look for the word "whole" as the first word in the ingredient list.  Choose fish and skinless chicken or Mal[place]urkey more often than red meat. Limit fish, poultry, and meat to 6 oz (170 g) each day.  Limit sweets, desserts, sugars, and sugary drinks.  Choose heart-healthy fats.  Limit cheese to 1 oz (28 g) per day.  Eat more home-cooked food and less restaurant, buffet, and fast food.  Limit fried foods.  Cook foods using methods other than frying.  Limit canned vegetables. If you do use them, rinse them well to decrease the sodium.  When eating at a restaurant, ask that your  food be prepared with less salt, or no salt if possible. WHAT FOODS CAN I EAT? Seek help from a dietitian for individual calorie needs. Grains Whole grain or whole wheat bread. Brown rice. Whole grain or whole wheat pasta. Quinoa, bulgur, and whole grain cereals. Low-sodium cereals. Corn or whole wheat flour tortillas. Whole grain cornbread. Whole grain crackers. Low-sodium crackers. Vegetables Fresh or frozen vegetables (raw, steamed, roasted, or grilled). Low-sodium or reduced-sodium tomato and vegetable juices. Low-sodium or reduced-sodium tomato sauce and paste. Low-sodium or reduced-sodium canned vegetables.  Fruits All fresh, canned (in natural juice), or frozen fruits. Meat and Other Protein Products Ground beef (85% or leaner), grass-fed beef, or beef trimmed of fat. Skinless chicken or [place]key. Ground chicken or [place]urkey. Pork trimmed of fat. All fish and seafood. Eggs. Dried beans, peas, or lentils. Unsalted nuts and seeds. Unsalted canned beans. Dairy Low-fat dairy products, such as skim or 1% milk, 2% or reduced-fat cheeses, low-fat ricotta or cottage cheese, or plain low-fat yogurt. Low-sodium or reduced-sodium cheeses. Fats and Oils Tub margarines without trans fats. Light or reduced-fat mayonnaise and salad dressings (reduced sodium). Avocado. Safflower, olive, or canola oils. Natural peanut or almond butter. Other Unsalted popcorn and pretzels. The items listed above may not be a complete list of recommended foods or beverages. Contact your dietitian for more options. WHAT FOODS ARE NOT RECOMMENDED? Grains White bread. White pasta. White rice. Refined cornbread. Bagels and croissants. Crackers that contain trans fat. Vegetables Creamed or fried  vegetables. Vegetables in a cheese sauce. Regular canned vegetables. Regular canned tomato sauce and paste. Regular tomato and vegetable juices. Fruits Dried fruits. Canned fruit in light or heavy syrup. Fruit juice. Meat and Other  Protein Products Fatty cuts of meat. Ribs, chicken wings, bacon, sausage, bologna, salami, chitterlings, fatback, hot dogs, bratwurst, and packaged luncheon meats. Salted nuts and seeds. Canned beans with salt. Dairy Whole or 2% milk, cream, half-and-half, and cream cheese. Whole-fat or sweetened yogurt. Full-fat cheeses or blue cheese. Nondairy creamers and whipped toppings. Processed cheese, cheese spreads, or cheese curds. Condiments Onion and garlic salt, seasoned salt, table salt, and sea salt. Canned and packaged gravies. Worcestershire sauce. Tartar sauce. Barbecue sauce. Teriyaki sauce. Soy sauce, including reduced sodium. Steak sauce. Fish sauce. Oyster sauce. Cocktail sauce. Horseradish. Ketchup and mustard. Meat flavorings and tenderizers. Bouillon cubes. Hot sauce. Tabasco sauce. Marinades. Taco seasonings. Relishes. Fats and Oils Butter, stick margarine, lard, shortening, ghee, and bacon fat. Coconut, palm kernel, or palm oils. Regular salad dressings. Other Pickles and olives. Salted popcorn and pretzels. The items listed above may not be a complete list of foods and beverages to avoid. Contact your dietitian for more information. WHERE CAN I FIND MORE INFORMATION? National Heart, Lung, and Blood Institute: [employer] Document Released: 12/17/2010 Document Revised: 05/14/2013 Document Reviewed: 11/01/2012 [facility] Patient Information 2015 [place], [place]. This information is not intended to replace advice given to you by your health care provider. Make sure you discuss any questions you have with your health care provider.

## 2013-10-10 NOTE — Progress Notes (Signed)
Pre visit review using our clinic review tool, if applicable. No additional management support is needed unless otherwise documented below in the visit note/SLS  

## 2013-10-10 NOTE — Assessment & Plan Note (Signed)
Improved since last visit.  Encouraged compressive brace, ice and elevation.  Continue Mobic daily.  Tylenol arthritis for breakthrough pain as patient cannot tolerate stronger pain medications.  Follow-up with Orthopedics as scheduled.

## 2013-10-10 NOTE — Assessment & Plan Note (Addendum)
BP first check at -- 187/85.  Patient endorses taking medications this morning.  Repeat BP check at end of visit -- 156/78. Asymptomatic.  Increase Amlodipine to 10 mg daily. Follow-up with nurse in 2 weeks for BP recheck.

## 2013-10-10 NOTE — Progress Notes (Signed)
Patient presents to clinic today c/o continue pain in left knee despite treatment for gout.  Patient was seen 1 week ago for knee pain after seeing his Orthopedist for joint aspiration.  Was diagnosed with gout and given Rx for colchicine with little relief in symptoms.  At time of visit with me, Rx medrol dose pack and tramadol given for knee pain.  Patient endorses improvement in symptoms with medications. Pain level is now at a 2 where as previously it was at a 9/10. Patient has significant PMH of osteoarthritis and post-polio syndrome of right lower extremity. Is now taking Meloxicam daily. Has appointment with Orthopedist this Friday.  Past Medical History  Diagnosis Date  . HTN (hypertension)   . Post-polio syndrome 1958    Right Leg  . Osteomyelitis of left leg   . Osteoarthritis     Right leg    Current Outpatient Prescriptions on File Prior to Visit  Medication Sig Dispense Refill  . amLODipine (NORVASC) 5 MG tablet Take 1 tablet (5 mg total) by mouth daily.  90 tablet  3  . dicloxacillin (DYNAPEN) 500 MG capsule Take 500 mg by mouth 4 (four) times daily.      Marland Kitchen losartan (COZAAR) 100 MG tablet Take 1 tablet (100 mg total) by mouth daily.  90 tablet  3  . meloxicam (MOBIC) 15 MG tablet Take 1 tablet (15 mg total) by mouth daily.  30 tablet  0  . naproxen (NAPROSYN) 500 MG tablet Take 500 mg by mouth 2 (two) times daily with a meal.      . traMADol (ULTRAM) 50 MG tablet Take 1 tablet (50 mg total) by mouth as directed. Every 4-6 hours PRN pain  30 tablet  0   No current facility-administered medications on file prior to visit.    No Known Allergies  Family History  Problem Relation Age of Onset  . Arthritis Father     Deceased  . Heart disease Mother     Deceased  . Hypertension Mother   . Lung cancer Mother   . Lung cancer Other     Aunts x3  . Healthy Sister     x1  . COPD Sister   . Healthy Brother     x2    History   Social History  . Marital Status:  Single    Spouse Name: N/A    Number of Children: N/A  . Years of Education: N/A   Social History Main Topics  . Smoking status: Never Smoker   . Smokeless tobacco: Current User    Types: Chew  . Alcohol Use: 4.8 oz/week    8 Shots of liquor per week  . Drug Use: No  . Sexual Activity: None   Other Topics Concern  . None   Social History Narrative  . None   Review of Systems - See HPI.  All other ROS are negative.  BP 187/85  Pulse 60  Temp(Src) 98.8 F (37.1 C) (Oral)  Resp 16  Ht 5' 7.5" (1.715 m)  Wt 196 lb 6 oz (89.075 kg)  BMI 30.29 kg/m2  SpO2 99%  Physical Exam  Vitals reviewed. Constitutional: He is oriented to person, place, and time and well-developed, well-nourished, and in no distress.  HENT:  Head: Normocephalic and atraumatic.  Eyes: Conjunctivae are normal.  Cardiovascular: Normal rate, regular rhythm and intact distal pulses.   Chronic II/VI systolic murmur.  Pulmonary/Chest: Effort normal and breath sounds normal.  Musculoskeletal:  Left knee: He exhibits decreased range of motion and swelling. He exhibits normal alignment, no LCL laxity, normal patellar mobility, normal meniscus and no MCL laxity. No tenderness found.  Neurological: He is alert and oriented to person, place, and time.  Skin: Skin is warm and dry. No rash noted.  Psychiatric: Affect normal.   Assessment/Plan: Knee effusion Improved since last visit.  Encouraged compressive brace, ice and elevation.  Continue Mobic daily.  Tylenol arthritis for breakthrough pain as patient cannot tolerate stronger pain medications.  Follow-up with Orthopedics as scheduled.  Essential hypertension, benign BP first check at -- 187/85.  Patient endorses taking medications this morning.  Repeat BP check at end of visit -- 156/78. Asymptomatic.  Increase Amlodipine to 10 mg daily. Follow-up with nurse in 2 weeks for BP recheck.

## 2013-10-29 ENCOUNTER — Ambulatory Visit: Payer: BC Managed Care – PPO | Admitting: Physician Assistant

## 2013-12-05 ENCOUNTER — Ambulatory Visit (INDEPENDENT_AMBULATORY_CARE_PROVIDER_SITE_OTHER): Payer: BC Managed Care – PPO | Admitting: Physician Assistant

## 2013-12-05 ENCOUNTER — Encounter: Payer: Self-pay | Admitting: Physician Assistant

## 2013-12-05 VITALS — BP 160/80 | HR 49 | Temp 98.0°F | Resp 16 | Ht 67.5 in | Wt 200.0 lb

## 2013-12-05 DIAGNOSIS — R0981 Nasal congestion: Secondary | ICD-10-CM | POA: Insufficient documentation

## 2013-12-05 DIAGNOSIS — I10 Essential (primary) hypertension: Secondary | ICD-10-CM

## 2013-12-05 MED ORDER — FLUTICASONE PROPIONATE 50 MCG/ACT NA SUSP: 2.0000 | Freq: Every day | NASAL | Status: DC

## 2013-12-05 MED ORDER — HYDRALAZINE HCL 10 MG PO TABS: 10.0000 mg | ORAL_TABLET | Freq: Two times a day (BID) | ORAL | Status: DC

## 2013-12-05 NOTE — Progress Notes (Signed)
   Patient presents to clinic today for follow-up of hypertension.  Patient endorses taking amlodipine and losartan daily as directed.  BP elevated at 160/80. Denies chest pain, palpitations, lightheadedness or dizziness.  Patient endorses nasal congestion and sinus pressure x 4 days that has been improving.  Denies fever, chills, malaise.  Denies cough or chest congestion.  Past Medical History  Diagnosis Date  . HTN (hypertension)   . Post-polio syndrome 1958    Right Leg  . Osteomyelitis of left leg   . Osteoarthritis     Right leg    Current Outpatient Prescriptions on File Prior to Visit  Medication Sig Dispense Refill  . amLODipine (NORVASC) 5 MG tablet Take 1 tablet (5 mg total) by mouth daily. 90 tablet 3  . losartan (COZAAR) 100 MG tablet Take 1 tablet (100 mg total) by mouth daily. 90 tablet 3   No current facility-administered medications on file prior to visit.    No Known Allergies  Family History  Problem Relation Age of Onset  . Arthritis Father     Deceased  . Heart disease Mother     Deceased  . Hypertension Mother   . Lung cancer Mother   . Lung cancer Other     Aunts x3  . Healthy Sister     x1  . COPD Sister   . Healthy Brother     x2    History   Social History  . Marital Status: Single    Spouse Name: N/A    Number of Children: N/A  . Years of Education: N/A   Social History Main Topics  . Smoking status: Never Smoker   . Smokeless tobacco: Current User    Types: Chew  . Alcohol Use: 4.8 oz/week    8 Shots of liquor per week  . Drug Use: No  . Sexual Activity: None   Other Topics Concern  . None   Social History Narrative   Review of Systems - See HPI.  All other ROS are negative.  BP 160/80 mmHg  Pulse 49  Temp(Src) 98 F (36.7 C) (Oral)  Resp 16  Ht 5' 7.5" (1.715 m)  Wt 200 lb (90.719 kg)  BMI 30.84 kg/m2  SpO2 100%  Physical Exam  Constitutional: He is oriented to person, place, and time and well-developed,  well-nourished, and in no distress.  HENT:  Head: Normocephalic and atraumatic.  Right Ear: External ear normal.  Left Ear: External ear normal.  Nose: Nose normal.  Mouth/Throat: Oropharynx is clear and moist. No oropharyngeal exudate.  TM within normal limits bilaterally.  Eyes: Conjunctivae are normal. Pupils are equal, round, and reactive to light.  Neck: Neck supple.  Cardiovascular: Normal rate, regular rhythm, normal heart sounds and intact distal pulses.   Pulmonary/Chest: Effort normal and breath sounds normal. No respiratory distress. He has no wheezes. He has no rales. He exhibits no tenderness.  Lymphadenopathy:    He has no cervical adenopathy.  Neurological: He is alert and oriented to person, place, and time.  Skin: Skin is warm and dry. No rash noted.  Vitals reviewed.   No results found for this or any previous visit (from the past 2160 hour(s)).  Assessment/Plan: Nasal congestion Improving.  Saline nasal spray.  Rx flonase.  Increase fluids.  Get plenty of rest.  Essential hypertension Will add BID hydralazine to current regimen.  DASH diet again encouraged.  Follow-up in 1 month.

## 2013-12-05 NOTE — Progress Notes (Signed)
Pre visit review using our clinic review tool, if applicable. No additional management support is needed unless otherwise documented below in the visit note. 

## 2013-12-05 NOTE — Assessment & Plan Note (Signed)
Will add BID hydralazine to current regimen.  DASH diet again encouraged.  Follow-up in 1 month.

## 2013-12-05 NOTE — Assessment & Plan Note (Signed)
Improving.  Saline nasal spray.  Rx flonase.  Increase fluids.  Get plenty of rest.

## 2013-12-05 NOTE — Patient Instructions (Signed)
Please continue the Amlodipine and the Losartan.  Add the hydralazine twice daily.  Continue the DASH diet listed below.  Follow-up in 1 month for a BP recheck.  For sinus symptoms -- increase fluids.  Use saline nasal spray and Flonase to help with nasal congestion.  Symptoms seem viral giving they are already rapidly improving.  DASH Eating Plan DASH stands for "Dietary Approaches to Stop Hypertension." The DASH eating plan is a healthy eating plan that has been shown to reduce high blood pressure (hypertension). Additional health benefits may include reducing the risk of type 2 diabetes mellitus, heart disease, and stroke. The DASH eating plan may also help with weight loss. WHAT DO I NEED TO KNOW ABOUT THE DASH EATING PLAN? For the DASH eating plan, you will follow these general guidelines:  Choose foods with a percent daily value for sodium of less than 5% (as listed on the food label).  Use salt-free seasonings or herbs instead of table salt or sea salt.  Check with your health care provider or pharmacist before using salt substitutes.  Eat lower-sodium products, often labeled as "lower sodium" or "no salt added."  Eat fresh foods.  Eat more vegetables, fruits, and low-fat dairy products.  Choose whole grains. Look for the word "whole" as the first word in the ingredient list.  Choose fish and skinless chicken or [place] more often than red meat. Limit fish, poultry, and meat to 6 oz (170 g) each day.  Limit sweets, desserts, sugars, and sugary drinks.  Choose heart-healthy fats.  Limit cheese to 1 oz (28 g) per day.  Eat more home-cooked food and less restaurant, buffet, and fast food.  Limit fried foods.  Cook foods using methods other than frying.  Limit canned vegetables. If you do use them, rinse them well to decrease the sodium.  When eating at a restaurant, ask that your food be prepared with less salt, or no salt if possible. WHAT FOODS CAN I EAT? Seek help from  a dietitian for individual calorie needs. Grains Whole grain or whole wheat bread. Brown rice. Whole grain or whole wheat pasta. Quinoa, bulgur, and whole grain cereals. Low-sodium cereals. Corn or whole wheat flour tortillas. Whole grain cornbread. Whole grain crackers. Low-sodium crackers. Vegetables Fresh or frozen vegetables (raw, steamed, roasted, or grilled). Low-sodium or reduced-sodium tomato and vegetable juices. Low-sodium or reduced-sodium tomato sauce and paste. Low-sodium or reduced-sodium canned vegetables.  Fruits All fresh, canned (in natural juice), or frozen fruits. Meat and Other Protein Products Ground beef (85% or leaner), grass-fed beef, or beef trimmed of fat. Skinless chicken or [place]. Ground chicken or [place]. Pork trimmed of fat. All fish and seafood. Eggs. Dried beans, peas, or lentils. Unsalted nuts and seeds. Unsalted canned beans. Dairy Low-fat dairy products, such as skim or 1% milk, 2% or reduced-fat cheeses, low-fat ricotta or cottage cheese, or plain low-fat yogurt. Low-sodium or reduced-sodium cheeses. Fats and Oils Tub margarines without trans fats. Light or reduced-fat mayonnaise and salad dressings (reduced sodium). Avocado. Safflower, olive, or canola oils. Natural peanut or almond butter. Other Unsalted popcorn and pretzels. The items listed above may not be a complete list of recommended foods or beverages. Contact your dietitian for more options. WHAT FOODS ARE NOT RECOMMENDED? Grains White bread. White pasta. White rice. Refined cornbread. Bagels and croissants. Crackers that contain trans fat. Vegetables Creamed or fried vegetables. Vegetables in a cheese sauce. Regular canned vegetables. Regular canned tomato sauce and paste. Regular tomato and vegetable juices. Fruits  Dried fruits. Canned fruit in light or heavy syrup. Fruit juice. Meat and Other Protein Products Fatty cuts of meat. Ribs, chicken wings, bacon, sausage, bologna, salami,  chitterlings, fatback, hot dogs, bratwurst, and packaged luncheon meats. Salted nuts and seeds. Canned beans with salt. Dairy Whole or 2% milk, cream, half-and-half, and cream cheese. Whole-fat or sweetened yogurt. Full-fat cheeses or blue cheese. Nondairy creamers and whipped toppings. Processed cheese, cheese spreads, or cheese curds. Condiments Onion and garlic salt, seasoned salt, table salt, and sea salt. Canned and packaged gravies. Worcestershire sauce. Tartar sauce. Barbecue sauce. Teriyaki sauce. Soy sauce, including reduced sodium. Steak sauce. Fish sauce. Oyster sauce. Cocktail sauce. Horseradish. Ketchup and mustard. Meat flavorings and tenderizers. Bouillon cubes. Hot sauce. Tabasco sauce. Marinades. Taco seasonings. Relishes. Fats and Oils Butter, stick margarine, lard, shortening, ghee, and bacon fat. Coconut, palm kernel, or palm oils. Regular salad dressings. Other Pickles and olives. Salted popcorn and pretzels. The items listed above may not be a complete list of foods and beverages to avoid. Contact your dietitian for more information. WHERE CAN I FIND MORE INFORMATION? National Heart, Lung, and Blood Institute: CablePromo.itwww.nhlbi.nih.gov/health/health-topics/topics/dash/ Document Released: 12/17/2010 Document Revised: 05/14/2013 Document Reviewed: 11/01/2012 [facility] Patient Information 2015 [place], [place]. This information is not intended to replace advice given to you by your health care provider. Make sure you discuss any questions you have with your health care provider.

## 2014-07-08 ENCOUNTER — Encounter: Payer: Self-pay | Admitting: Physician Assistant

## 2014-07-08 ENCOUNTER — Ambulatory Visit (INDEPENDENT_AMBULATORY_CARE_PROVIDER_SITE_OTHER): Payer: BC Managed Care – PPO | Admitting: Physician Assistant

## 2014-07-08 VITALS — BP 120/80 | HR 84 | Temp 98.1°F | Ht 67.5 in | Wt 204.8 lb

## 2014-07-08 DIAGNOSIS — T148 Other injury of unspecified body region: Secondary | ICD-10-CM | POA: Diagnosis not present

## 2014-07-08 DIAGNOSIS — T148XXA Other injury of unspecified body region, initial encounter: Secondary | ICD-10-CM

## 2014-07-08 DIAGNOSIS — L24A9 Irritant contact dermatitis due friction or contact with other specified body fluids: Secondary | ICD-10-CM | POA: Insufficient documentation

## 2014-07-08 NOTE — Assessment & Plan Note (Signed)
Suspect normal fluid from tract.  Will culture and send to lab for further assessment. Follow-up with Ortho as scheduled.

## 2014-07-08 NOTE — Progress Notes (Signed)
   Patient presents to clinic today requesting analysis of fluid coming from a sinus track just superior to left knee at site of recent arthrocentesis.  Patient was told by Orthopedic Surgery that the tract would close and there was no concern for infection.  Patient denies pain, redness, swelling or fever.  Past Medical History  Diagnosis Date  . HTN (hypertension)   . Post-polio syndrome 1958    Right Leg  . Osteomyelitis of left leg   . Osteoarthritis     Right leg    Current Outpatient Prescriptions on File Prior to Visit  Medication Sig Dispense Refill  . amLODipine (NORVASC) 5 MG tablet Take 1 tablet (5 mg total) by mouth daily. 90 tablet 3  . losartan (COZAAR) 100 MG tablet Take 1 tablet (100 mg total) by mouth daily. 90 tablet 3  . fluticasone (FLONASE) 50 MCG/ACT nasal spray Place 2 sprays into both nostrils daily. (Patient not taking: Reported on 07/08/2014) 16 g 6  . hydrALAZINE (APRESOLINE) 10 MG tablet Take 1 tablet (10 mg total) by mouth 2 (two) times daily. (Patient not taking: Reported on 07/08/2014) 60 tablet 0   No current facility-administered medications on file prior to visit.    No Known Allergies  Family History  Problem Relation Age of Onset  . Arthritis Father     Deceased  . Heart disease Mother     Deceased  . Hypertension Mother   . Lung cancer Mother   . Lung cancer Other     Aunts x3  . Healthy Sister     x1  . COPD Sister   . Healthy Brother     x2    History   Social History  . Marital Status: Single    Spouse Name: N/A  . Number of Children: N/A  . Years of Education: N/A   Social History Main Topics  . Smoking status: Never Smoker   . Smokeless tobacco: Current User    Types: Chew  . Alcohol Use: 4.8 oz/week    8 Shots of liquor per week  . Drug Use: No  . Sexual Activity: Not on file   Other Topics Concern  . None   Social History Narrative   Review of Systems - See HPI.  All other ROS are negative.  BP 120/80 mmHg   Pulse 84  Temp(Src) 98.1 F (36.7 C) (Oral)  Ht 5' 7.5" (1.715 m)  Wt 204 lb 12.8 oz (92.897 kg)  BMI 31.58 kg/m2  SpO2 100%  Physical Exam  Constitutional: He is oriented to person, place, and time and well-developed, well-nourished, and in no distress.  HENT:  Head: Normocephalic and atraumatic.  Cardiovascular: Normal rate and regular rhythm.   Pulmonary/Chest: Effort normal.  Neurological: He is alert and oriented to person, place, and time.  Skin: Skin is warm and dry.     Vitals reviewed.    Assessment/Plan: Drainage from wound Suspect normal fluid from tract.  Will culture and send to lab for further assessment. Follow-up with Ortho as scheduled.

## 2014-07-08 NOTE — Progress Notes (Signed)
Pre visit review using our clinic review tool, if applicable. No additional management support is needed unless otherwise documented below in the visit note. 

## 2014-07-11 LAB — WOUND CULTURE
GRAM STAIN: NONE SEEN
Gram Stain: NONE SEEN
Gram Stain: NONE SEEN
ORGANISM ID, BACTERIA: NO GROWTH

## 2014-10-02 ENCOUNTER — Other Ambulatory Visit: Payer: Self-pay | Admitting: Physician Assistant

## 2014-12-17 ENCOUNTER — Encounter: Payer: Self-pay | Admitting: Physician Assistant

## 2014-12-17 ENCOUNTER — Ambulatory Visit (INDEPENDENT_AMBULATORY_CARE_PROVIDER_SITE_OTHER): Payer: BC Managed Care – PPO | Admitting: Physician Assistant

## 2014-12-17 VITALS — BP 166/70 | HR 60 | Temp 98.1°F | Ht 67.5 in | Wt 209.0 lb

## 2014-12-17 DIAGNOSIS — Z23 Encounter for immunization: Secondary | ICD-10-CM | POA: Diagnosis not present

## 2014-12-17 DIAGNOSIS — I10 Essential (primary) hypertension: Secondary | ICD-10-CM

## 2014-12-17 DIAGNOSIS — M25169 Fistula, unspecified knee: Secondary | ICD-10-CM | POA: Insufficient documentation

## 2014-12-17 DIAGNOSIS — M25161 Fistula, right knee: Secondary | ICD-10-CM | POA: Diagnosis not present

## 2014-12-17 MED ORDER — HYDRALAZINE HCL 10 MG PO TABS: 10.0000 mg | ORAL_TABLET | Freq: Two times a day (BID) | ORAL | Status: DC

## 2014-12-17 NOTE — Patient Instructions (Signed)
Keep your phone on as you will be contacted by Orthopedic Surgery for assessment.  You should be taking the hydralazine. I have sent in a refill.  Follow-up 1 month for a physical.

## 2014-12-17 NOTE — Progress Notes (Signed)
Pre visit review using our clinic review tool, if applicable. No additional management support is needed unless otherwise documented below in the visit note. 

## 2014-12-17 NOTE — Progress Notes (Signed)
    Patient presents to clinic today c/o drainage from his left knee joint. Denies pain, swelling, redness or tenderness. States drainage present since arthroscopic repair of knee. Has not seen Orthopedics.  Past Medical History  Diagnosis Date  . HTN (hypertension)   . Post-polio syndrome 1958    Right Leg  . Osteomyelitis of left leg (HCC)   . Osteoarthritis     Right leg    Current Outpatient Prescriptions on File Prior to Visit  Medication Sig Dispense Refill  . amLODipine (NORVASC) 5 MG tablet TAKE 1 TABLET BY MOUTH EVERY DAY 90 tablet 0  . losartan (COZAAR) 100 MG tablet TAKE 1 TABLET BY MOUTH EVERY DAY 90 tablet 0  . fluticasone (FLONASE) 50 MCG/ACT nasal spray Place 2 sprays into both nostrils daily. (Patient not taking: Reported on 07/08/2014) 16 g 6   No current facility-administered medications on file prior to visit.    No Known Allergies  Family History  Problem Relation Age of Onset  . Arthritis Father     Deceased  . Heart disease Mother     Deceased  . Hypertension Mother   . Lung cancer Mother   . Lung cancer Other     Aunts x3  . Healthy Sister     x1  . COPD Sister   . Healthy Brother     x2    Social History   Social History  . Marital Status: Single    Spouse Name: N/A  . Number of Children: N/A  . Years of Education: N/A   Social History Main Topics  . Smoking status: Never Smoker   . Smokeless tobacco: Current User    Types: Chew  . Alcohol Use: 4.8 oz/week    8 Shots of liquor per week  . Drug Use: No  . Sexual Activity: Not Asked   Other Topics Concern  . None   Social History Narrative   Review of Systems - See HPI.  All other ROS are negative.  BP 166/70 mmHg  Pulse 60  Temp(Src) 98.1 F (36.7 C) (Oral)  Ht 5' 7.5" (1.715 m)  Wt 209 lb (94.802 kg)  BMI 32.23 kg/m2  SpO2 99%  Physical Exam  Constitutional: He is oriented to person, place, and time and well-developed, well-nourished, and in no distress.  HENT:    Head: Normocephalic and atraumatic.  Eyes: Conjunctivae are normal.  Cardiovascular: Normal rate, regular rhythm, normal heart sounds and intact distal pulses.   Pulmonary/Chest: Effort normal and breath sounds normal. No respiratory distress. He has no wheezes. He has no rales. He exhibits no tenderness.  Musculoskeletal:       Left knee: He exhibits normal range of motion and no swelling. No tenderness found.       Legs: Neurological: He is alert and oriented to person, place, and time.  Vitals reviewed.   No results found for this or any previous visit (from the past 2160 hour(s)).  Assessment/Plan: Fistula of knee Referral to Ortho placed for assessment and correction

## 2014-12-17 NOTE — Assessment & Plan Note (Signed)
Referral to Ortho placed for assessment and correction

## 2015-01-03 ENCOUNTER — Telehealth: Payer: Self-pay | Admitting: Physician Assistant

## 2015-01-03 ENCOUNTER — Other Ambulatory Visit: Payer: Self-pay | Admitting: Physician Assistant

## 2015-01-03 NOTE — Telephone Encounter (Signed)
Pt: Mark Humphrey Tel [phone] Pt came in office stating is needing to be establish with a Doctor as his PCP and would like to be see by Dr. Laury AxonLowne if ok by PA. Please advise.

## 2015-01-03 NOTE — Telephone Encounter (Signed)
ok 

## 2015-01-03 NOTE — Telephone Encounter (Signed)
Fine with me

## 2015-01-07 NOTE — Telephone Encounter (Signed)
Pt was scheduled 04-08-14 as new pt with [facility].

## 2015-01-14 ENCOUNTER — Encounter: Payer: BC Managed Care – PPO | Admitting: Physician Assistant

## 2015-03-03 ENCOUNTER — Telehealth: Payer: Self-pay | Admitting: Physician Assistant

## 2015-03-03 NOTE — Telephone Encounter (Signed)
Call to assess further. He needs to see his orthopedic specialist regarding his knee and drainage. This has been a chronic issue that they are following.

## 2015-03-03 NOTE — Telephone Encounter (Signed)
Called and [facility] @ 12:12pm @ [phone]) asking the pt to RTC regarding note below.//AB/CMA

## 2015-03-03 NOTE — Telephone Encounter (Signed)
Caller name: Artur  Relationship to patient: Self   Can be reached: [phone]  Reason for call: Pt says that he need labs and fluid culture. Pt needs orders in.

## 2015-03-05 NOTE — Telephone Encounter (Signed)
Called and spoke with the pt and informed him of the note below.  Pt verbalized understanding and agreed to call the Orthopedic office.//AB/CMA

## 2015-03-30 ENCOUNTER — Other Ambulatory Visit: Payer: Self-pay | Admitting: Physician Assistant

## 2015-03-31 NOTE — Telephone Encounter (Signed)
Medication filled to pharmacy as requested.   

## 2015-04-07 ENCOUNTER — Telehealth: Payer: Self-pay

## 2015-04-07 NOTE — Telephone Encounter (Signed)
Pre visit call made to patient. States he has cancelled appointment and rescheduled with another provider because he did not want to wait so long.  Apologized to patient.

## 2015-04-08 ENCOUNTER — Ambulatory Visit: Payer: BC Managed Care – PPO | Admitting: Family Medicine

## 2017-06-02 ENCOUNTER — Encounter: Payer: Self-pay | Admitting: Emergency Medicine

## 2017-11-23 ENCOUNTER — Ambulatory Visit: Payer: BC Managed Care – PPO | Admitting: Physician Assistant

## 2017-11-23 ENCOUNTER — Other Ambulatory Visit: Payer: Self-pay

## 2017-11-23 ENCOUNTER — Encounter: Payer: Self-pay | Admitting: Physician Assistant

## 2017-11-23 VITALS — BP 150/80 | HR 54 | Temp 98.0°F | Resp 16 | Ht 67.0 in | Wt 207.0 lb

## 2017-11-23 DIAGNOSIS — I10 Essential (primary) hypertension: Secondary | ICD-10-CM | POA: Diagnosis not present

## 2017-11-23 DIAGNOSIS — R5382 Chronic fatigue, unspecified: Secondary | ICD-10-CM

## 2017-11-23 LAB — COMPREHENSIVE METABOLIC PANEL
ALT: 12 U/L (ref 0–53)
AST: 15 U/L (ref 0–37)
Albumin: 4.2 g/dL (ref 3.5–5.2)
Alkaline Phosphatase: 112 U/L (ref 39–117)
BUN: 13 mg/dL (ref 6–23)
CALCIUM: 9.4 mg/dL (ref 8.4–10.5)
CHLORIDE: 106 meq/L (ref 96–112)
CO2: 30 meq/L (ref 19–32)
Creatinine, Ser: 0.84 mg/dL (ref 0.40–1.50)
GFR: 118.41 mL/min (ref >60.00)
GLUCOSE: 94 mg/dL (ref 70–99)
Potassium: 4.4 mEq/L (ref 3.5–5.1)
Sodium: 141 mEq/L (ref 135–145)
Total Bilirubin: 0.5 mg/dL (ref 0.2–1.2)
Total Protein: 7.1 g/dL (ref 6.0–8.3)

## 2017-11-23 LAB — CBC WITH DIFFERENTIAL/PLATELET
BASOS ABS: 0 10*3/uL (ref 0.0–0.1)
Basophils Relative: 0.5 % (ref 0.0–3.0)
Eosinophils Absolute: 0.1 10*3/uL (ref 0.0–0.7)
Eosinophils Relative: 1.1 % (ref 0.0–5.0)
HEMATOCRIT: 41.6 % (ref 39.0–52.0)
HEMOGLOBIN: 14.2 g/dL (ref 13.0–17.0)
LYMPHS PCT: 36.5 % (ref 12.0–46.0)
Lymphs Abs: 2.1 10*3/uL (ref 0.7–4.0)
MCHC: 34.1 g/dL (ref 30.0–36.0)
MCV: 90.3 fl (ref 78.0–100.0)
MONOS PCT: 8.5 % (ref 3.0–12.0)
Monocytes Absolute: 0.5 10*3/uL (ref 0.1–1.0)
NEUTROS ABS: 3.1 10*3/uL (ref 1.4–7.7)
Neutrophils Relative %: 53.4 % (ref 43.0–77.0)
Platelets: 256 10*3/uL (ref 150.0–400.0)
RBC: 4.6 Mil/uL (ref 4.22–5.81)
RDW: 13.9 % (ref 11.5–15.5)
WBC: 5.7 10*3/uL (ref 4.0–10.5)

## 2017-11-23 LAB — VITAMIN D 25 HYDROXY (VIT D DEFICIENCY, FRACTURES): VITD: 14.19 ng/mL — ABNORMAL LOW (ref 30.00–100.00)

## 2017-11-23 LAB — TSH: TSH: 1.61 u[IU]/mL (ref 0.35–4.50)

## 2017-11-23 MED ORDER — LOSARTAN POTASSIUM 50 MG PO TABS: 50.0000 mg | ORAL_TABLET | Freq: Every day | ORAL | 1 refills | Status: DC

## 2017-11-23 NOTE — Patient Instructions (Signed)
Please go to the lab today for blood work.  I will call you with your results. We will alter treatment regimen(s) if indicated by your results.   Please restart the losartan at the lower dose daily. Follow the diet below.  Return in 2 weeks for reassessment and your complete physical.   DASH Eating Plan DASH stands for "Dietary Approaches to Stop Hypertension." The DASH eating plan is a healthy eating plan that has been shown to reduce high blood pressure (hypertension). It may also reduce your risk for type 2 diabetes, heart disease, and stroke. The DASH eating plan may also help with weight loss. What are tips for following this plan? General guidelines  Avoid eating more than 2,300 mg (milligrams) of salt (sodium) a day. If you have hypertension, you may need to reduce your sodium intake to 1,500 mg a day.  Limit alcohol intake to no more than 1 drink a day for nonpregnant women and 2 drinks a day for men. One drink equals 12 oz of beer, 5 oz of wine, or 1 oz of hard liquor.  Work with your health care provider to maintain a healthy body weight or to lose weight. Ask what an ideal weight is for you.  Get at least 30 minutes of exercise that causes your heart to beat faster (aerobic exercise) most days of the week. Activities may include walking, swimming, or biking.  Work with your health care provider or diet and nutrition specialist (dietitian) to adjust your eating plan to your individual calorie needs. Reading food labels  Check food labels for the amount of sodium per serving. Choose foods with less than 5 percent of the Daily Value of sodium. Generally, foods with less than 300 mg of sodium per serving fit into this eating plan.  To find whole grains, look for the word "whole" as the first word in the ingredient list. Shopping  Buy products labeled as "low-sodium" or "no salt added."  Buy fresh foods. Avoid canned foods and premade or frozen meals. Cooking  Avoid adding  salt when cooking. Use salt-free seasonings or herbs instead of table salt or sea salt. Check with your health care provider or pharmacist before using salt substitutes.  Do not fry foods. Cook foods using healthy methods such as baking, boiling, grilling, and broiling instead.  Cook with heart-healthy oils, such as olive, canola, soybean, or sunflower oil. Meal planning   Eat a balanced diet that includes: ? 5 or more servings of fruits and vegetables each day. At each meal, try to fill half of your plate with fruits and vegetables. ? Up to 6-8 servings of whole grains each day. ? Less than 6 oz of lean meat, poultry, or fish each day. A 3-oz serving of meat is about the same size as a deck of cards. One egg equals 1 oz. ? 2 servings of low-fat dairy each day. ? A serving of nuts, seeds, or beans 5 times each week. ? Heart-healthy fats. Healthy fats called Omega-3 fatty acids are found in foods such as flaxseeds and coldwater fish, like sardines, salmon, and mackerel.  Limit how much you eat of the following: ? Canned or prepackaged foods. ? Food that is high in trans fat, such as fried foods. ? Food that is high in saturated fat, such as fatty meat. ? Sweets, desserts, sugary drinks, and other foods with added sugar. ? Full-fat dairy products.  Do not salt foods before eating.  Try to eat at least 2  vegetarian meals each week.  Eat more home-cooked food and less restaurant, buffet, and fast food.  When eating at a restaurant, ask that your food be prepared with less salt or no salt, if possible. What foods are recommended? The items listed may not be a complete list. Talk with your dietitian about what dietary choices are best for you. Grains Whole-grain or whole-wheat bread. Whole-grain or whole-wheat pasta. Brown rice. [place]. Bulgur. Whole-grain and low-sodium cereals. Pita bread. Low-fat, low-sodium crackers. Whole-wheat flour tortillas. Vegetables Fresh or frozen  vegetables (raw, steamed, roasted, or grilled). Low-sodium or reduced-sodium tomato and vegetable juice. Low-sodium or reduced-sodium tomato sauce and tomato paste. Low-sodium or reduced-sodium canned vegetables. Fruits All fresh, dried, or frozen fruit. Canned fruit in natural juice (without added sugar). Meat and other protein foods Skinless chicken or [place]. Ground chicken or [place]. Pork with fat trimmed off. Fish and seafood. Egg whites. Dried beans, peas, or lentils. Unsalted nuts, nut butters, and seeds. Unsalted canned beans. Lean cuts of beef with fat trimmed off. Low-sodium, lean deli meat. Dairy Low-fat (1%) or fat-free (skim) milk. Fat-free, low-fat, or reduced-fat cheeses. Nonfat, low-sodium ricotta or cottage cheese. Low-fat or nonfat yogurt. Low-fat, low-sodium cheese. Fats and oils Soft margarine without trans fats. Vegetable oil. Low-fat, reduced-fat, or light mayonnaise and salad dressings (reduced-sodium). Canola, safflower, olive, soybean, and sunflower oils. Avocado. Seasoning and other foods Herbs. Spices. Seasoning mixes without salt. Unsalted popcorn and pretzels. Fat-free sweets. What foods are not recommended? The items listed may not be a complete list. Talk with your dietitian about what dietary choices are best for you. Grains Baked goods made with fat, such as croissants, muffins, or some breads. Dry pasta or rice meal packs. Vegetables Creamed or fried vegetables. Vegetables in a cheese sauce. Regular canned vegetables (not low-sodium or reduced-sodium). Regular canned tomato sauce and paste (not low-sodium or reduced-sodium). Regular tomato and vegetable juice (not low-sodium or reduced-sodium). Angie Fava. Olives. Fruits Canned fruit in a light or heavy syrup. Fried fruit. Fruit in cream or butter sauce. Meat and other protein foods Fatty cuts of meat. Ribs. Fried meat. Berniece Salines. Sausage. Bologna and other processed lunch meats. Salami. Fatback. Hotdogs. Bratwurst.  Salted nuts and seeds. Canned beans with added salt. Canned or smoked fish. Whole eggs or egg yolks. Chicken or [place] with skin. Dairy Whole or 2% milk, cream, and half-and-half. Whole or full-fat cream cheese. Whole-fat or sweetened yogurt. Full-fat cheese. Nondairy creamers. Whipped toppings. Processed cheese and cheese spreads. Fats and oils Butter. Stick margarine. Lard. Shortening. Ghee. Bacon fat. Tropical oils, such as coconut, palm kernel, or palm oil. Seasoning and other foods Salted popcorn and pretzels. Onion salt, garlic salt, seasoned salt, table salt, and sea salt. Worcestershire sauce. Tartar sauce. Barbecue sauce. Teriyaki sauce. Soy sauce, including reduced-sodium. Steak sauce. Canned and packaged gravies. Fish sauce. Oyster sauce. Cocktail sauce. Horseradish that you find on the shelf. Ketchup. Mustard. Meat flavorings and tenderizers. Bouillon cubes. Hot sauce and Tabasco sauce. Premade or packaged marinades. Premade or packaged taco seasonings. Relishes. Regular salad dressings. Where to find more information:  National Heart, Lung, and [place]: https://wilson-eaton.com/  American Heart Association: www.heart.org Summary  The DASH eating plan is a healthy eating plan that has been shown to reduce high blood pressure (hypertension). It may also reduce your risk for type 2 diabetes, heart disease, and stroke.  With the DASH eating plan, you should limit salt (sodium) intake to 2,300 mg a day. If you have hypertension, you may need  to reduce your sodium intake to 1,500 mg a day.  When on the DASH eating plan, aim to eat more fresh fruits and vegetables, whole grains, lean proteins, low-fat dairy, and heart-healthy fats.  Work with your health care provider or diet and nutrition specialist (dietitian) to adjust your eating plan to your individual calorie needs. This information is not intended to replace advice given to you by your health care provider. Make sure you discuss any  questions you have with your health care provider. Document Released: 12/17/2010 Document Revised: 12/22/2015 Document Reviewed: 12/22/2015 Elsevier Interactive Patient Education  [employer].

## 2017-11-23 NOTE — Progress Notes (Signed)
Patient presents to clinic today to discuss hypertension. Patient has not been seen or managed by this provider in almost 3 years. Notes being out of BP medication for some time. Has noted some fatigue over the past couple of months. Notes trying to eat well and stays active at the gym. Patient denies chest pain, palpitations, lightheadedness, dizziness, vision changes or frequent headaches. Endorses he is resting well at night. Endorses mood has been doing overall. Notes he is getting somewhat anxious of getting older and the health issues associated with this.   Past Medical History:  Diagnosis Date  . HTN (hypertension)   . Osteoarthritis    Right leg  . Osteomyelitis of left leg (HCC)   . Post-polio syndrome 1958   Right Leg    Current Outpatient Medications on File Prior to Visit  Medication Sig Dispense Refill  . losartan (COZAAR) 100 MG tablet TAKE 1 TABLET BY MOUTH EVERY DAY (Patient not taking: Reported on 11/23/2017) 90 tablet 0   No current facility-administered medications on file prior to visit.     No Known Allergies  Family History  Problem Relation Age of Onset  . Arthritis Father        Deceased  . Heart disease Mother        Deceased  . Hypertension Mother   . Lung cancer Mother   . Lung cancer Other        Aunts x3  . Healthy Sister        x1  . COPD Sister   . Healthy Brother        x2    Social History   Socioeconomic History  . Marital status: Single    Spouse name: Not on file  . Number of children: Not on file  . Years of education: Not on file  . Highest education level: Not on file  Occupational History  . Not on file  Social Needs  . Financial resource strain: Not on file  . Food insecurity:    Worry: Not on file    Inability: Not on file  . Transportation needs:    Medical: Not on file    Non-medical: Not on file  Tobacco Use  . Smoking status: Never Smoker  . Smokeless tobacco: Current User    Types: Chew  Substance and  Sexual Activity  . Alcohol use: Yes    Alcohol/week: 8.0 standard drinks    Types: 8 Shots of liquor per week  . Drug use: No  . Sexual activity: Not on file  Lifestyle  . Physical activity:    Days per week: Not on file    Minutes per session: Not on file  . Stress: Not on file  Relationships  . Social connections:    Talks on phone: Not on file    Gets together: Not on file    Attends religious service: Not on file    Active member of club or organization: Not on file    Attends meetings of clubs or organizations: Not on file    Relationship status: Not on file  Other Topics Concern  . Not on file  Social History Narrative  . Not on file   Review of Systems - See HPI.  All other ROS are negative.  BP (!) 158/92   Pulse (!) 54   Temp 98 F (36.7 C) (Oral)   Resp 16   Ht 5' 7"  (1.702 m)   Wt 207 lb (93.9 kg)  SpO2 99%   BMI 32.42 kg/m   Physical Exam  Constitutional: He is oriented to person, place, and time. He appears well-developed and well-nourished.  HENT:  Head: Normocephalic and atraumatic.  Eyes: Conjunctivae are normal.  Neck: Neck supple.  Cardiovascular: Normal rate, regular rhythm, normal heart sounds and intact distal pulses.  Pulmonary/Chest: Effort normal and breath sounds normal.  Neurological: He is alert and oriented to person, place, and time.  Psychiatric: He has a normal mood and affect.  Vitals reviewed.  Assessment/Plan: 1. Essential hypertension, benign Long-standing history. Out of medication for a couple of years. Some fatigue but otherwise asymptomatic. Will restart Losartan at a 25 mg dose. DASH diet. Follow-up 4 weeks.   2. Chronic fatigue - CBC w/Diff - TSH - Vitamin D (25 hydroxy) - Comp Met (CMET)   Leeanne Rio, PA-C

## 2017-11-24 ENCOUNTER — Other Ambulatory Visit: Payer: Self-pay | Admitting: Physician Assistant

## 2017-11-24 DIAGNOSIS — E559 Vitamin D deficiency, unspecified: Secondary | ICD-10-CM

## 2017-11-24 MED ORDER — VITAMIN D (ERGOCALCIFEROL) 1.25 MG (50000 UNIT) PO CAPS: ORAL_CAPSULE | ORAL | 0 refills | Status: DC

## 2017-12-07 ENCOUNTER — Encounter: Payer: BC Managed Care – PPO | Admitting: Physician Assistant

## 2018-01-09 ENCOUNTER — Telehealth: Payer: Self-pay

## 2018-01-09 NOTE — Telephone Encounter (Signed)
Copied from CRM [phone]. Topic: Appointment Scheduling - Transfer of Care >> Jan 09, 2018 10:57 AM Lynne LoganHudson, Caryn D wrote: Pt is requesting to transfer FROM: Malva Coganody Martin, PA-C Pt is requesting to transfer TO: Marcheta GrammesNicolas Wendling, DO Reason for requested transfer: [facility]  Send CRM to patient's current PCP (transferring FROM).

## 2018-01-09 NOTE — Telephone Encounter (Signed)
OK w me.  

## 2018-01-09 NOTE — Telephone Encounter (Signed)
Ok to transfer. 

## 2018-01-17 NOTE — Telephone Encounter (Signed)
Pt scheduled for February 02, 2018. Done

## 2018-01-18 ENCOUNTER — Other Ambulatory Visit: Payer: Self-pay | Admitting: Physician Assistant

## 2018-01-23 ENCOUNTER — Encounter: Payer: BC Managed Care – PPO | Admitting: Family Medicine

## 2018-01-30 ENCOUNTER — Other Ambulatory Visit: Payer: Self-pay | Admitting: Physician Assistant

## 2018-01-30 DIAGNOSIS — E559 Vitamin D deficiency, unspecified: Secondary | ICD-10-CM

## 2018-02-02 ENCOUNTER — Other Ambulatory Visit: Payer: Self-pay | Admitting: Family Medicine

## 2018-02-02 ENCOUNTER — Ambulatory Visit: Payer: BC Managed Care – PPO | Admitting: Family Medicine

## 2018-02-02 ENCOUNTER — Encounter: Payer: Self-pay | Admitting: Family Medicine

## 2018-02-02 VITALS — BP 142/82 | HR 68 | Temp 98.5°F | Ht 67.0 in | Wt 210.1 lb

## 2018-02-02 DIAGNOSIS — M8660 Other chronic osteomyelitis, unspecified site: Secondary | ICD-10-CM

## 2018-02-02 DIAGNOSIS — I10 Essential (primary) hypertension: Secondary | ICD-10-CM | POA: Diagnosis not present

## 2018-02-02 DIAGNOSIS — Z23 Encounter for immunization: Secondary | ICD-10-CM

## 2018-02-02 DIAGNOSIS — R002 Palpitations: Secondary | ICD-10-CM | POA: Insufficient documentation

## 2018-02-02 DIAGNOSIS — E559 Vitamin D deficiency, unspecified: Secondary | ICD-10-CM

## 2018-02-02 LAB — VITAMIN D 25 HYDROXY (VIT D DEFICIENCY, FRACTURES): VITD: 24.54 ng/mL — AB (ref 30.00–100.00)

## 2018-02-02 MED ORDER — VITAMIN D (ERGOCALCIFEROL) 1.25 MG (50000 UNIT) PO CAPS: ORAL_CAPSULE | ORAL | 0 refills | Status: DC

## 2018-02-02 NOTE — Patient Instructions (Signed)
Cut out the caffeine, alcohol and nicotine to see how things go.  Stay hydrated and stay active.  Give [place] 2-3 business days to get the results of your labs back.   Your BP looks good today. Let me know when you need refills.  I want you to see an ID provider in the area who is in network at some point. please let me know when you are ready for this.  Let us know if you need anything.

## 2018-02-02 NOTE — Progress Notes (Signed)
Pre visit review using our clinic review tool, if applicable. No additional management support is needed unless otherwise documented below in the visit note. 

## 2018-02-02 NOTE — Progress Notes (Signed)
Chief Complaint  Patient presents with  . New Patient (Initial Visit)    Subjective: Patient is a 63 y.o. male here for toc.  Patient is a 20-year history of palpitations.  They will happen very intermittently and seem to be associated with caffeine consumption and chewing on cigars (he does not like to smoke).  He denies any chest pain, shortness of breath, dizziness, or lightheadedness when it happens.  It lasts for 1-2 beats.  No current issues.  He has a history of hypertension well-controlled on losartan 50 mg daily and amlodipine 2.5 mg daily.  He is tolerating the medicines well.  He is compliant.  Diet is fair, he tries to stay active.  He was diagnosed with vitamin D deficiency and is currently taking the weekly prescription dose of vitamin D.  He is wondering if he still needs to be taking it.  He has a history of chronic osteomyelitis.  He was seen by the infectious disease team in [place].  He stopped seeing them because they were out of network.  He is not currently seeing an infectious disease specialist.   ROS: Heart:+ Palpitations Lungs: Denies SOB   Past Medical History:  Diagnosis Date  . HTN (hypertension)   . Osteoarthritis    Right leg  . Osteomyelitis of left leg (HCC)   . Post-polio syndrome 1958   Right Leg   Objective: BP (!) 142/82 (BP Location: Left Arm, Patient Position: Sitting, Cuff Size: Large)   Pulse 68   Temp 98.5 F (36.9 C) (Oral)   Ht 5\' 7"  (1.702 m)   Wt 210 lb 2 oz (95.3 kg)   SpO2 98%   BMI 32.91 kg/m  General: Awake, appears stated age HEENT: MMM Heart: RRR, +SEM at aortic listening post, no LE edema Lungs: CTAB, no rales, wheezes or rhonchi. No accessory muscle use Psych: Age appropriate judgment and insight, normal affect and mood  Assessment and Plan: Palpitations  Essential hypertension  Vitamin D deficiency - Plan: Vitamin D (25 hydroxy)  Chronic osteomyelitis (HCC)  Need for tetanus booster - Plan: Tdap vaccine greater  than or equal to 7yo IM  #1-Cut out sugars, likely PVCs.  Declined EKG and Holter monitor at this time.  He will let me know in the next 1 to 2 weeks if we need to pursue this further. #2-patient is controlled for age, continue medications as ordered. #3-check vitamin D today, continue weekly supplement for now. #4-I stated I would like for him to see an ID specialist in network.  He is agreeable to this once his palpitations are figured out as he is not having any current issues. I will otherwise plan to see him in 6 months for a physical. The patient voiced understanding and agreement to the plan.  Jilda Roche Lou­za, DO 02/02/18  8:01 AM

## 2018-03-10 ENCOUNTER — Telehealth: Payer: Self-pay | Admitting: Family Medicine

## 2018-03-10 MED ORDER — MELOXICAM 15 MG PO TABS: 15.0000 mg | ORAL_TABLET | Freq: Every day | ORAL | 0 refills | Status: DC | PRN

## 2018-03-10 NOTE — Telephone Encounter (Signed)
Patient informed. 

## 2018-03-10 NOTE — Telephone Encounter (Signed)
Sent!

## 2018-03-10 NOTE — Telephone Encounter (Signed)
Copied from CRM [id number]. Topic: General - Inquiry >> Mar 10, 2018  8:56 AM Maia Petties wrote: Reason for CRM: Pt called stating he has severe arthritis and his right ankle is hurting him. He states at new pt appt 02/02/2018 he was "having a good day" and didn't need medication at that time but that he discussed this chronic issue with Dr. Carmelia Roller. Pt requesting a prescription strength medication be sent to the pharmacy for him. Please advise.  [id number] - [place], [place] - [address] [phone] (Phone) [phone] (Fax)

## 2018-04-06 ENCOUNTER — Other Ambulatory Visit: Payer: Self-pay | Admitting: Family Medicine

## 2018-04-25 ENCOUNTER — Other Ambulatory Visit: Payer: Self-pay | Admitting: Family Medicine

## 2018-04-25 DIAGNOSIS — E559 Vitamin D deficiency, unspecified: Secondary | ICD-10-CM

## 2018-05-04 ENCOUNTER — Other Ambulatory Visit: Payer: BC Managed Care – PPO

## 2018-05-04 ENCOUNTER — Other Ambulatory Visit: Payer: Self-pay | Admitting: Physician Assistant

## 2018-05-05 ENCOUNTER — Other Ambulatory Visit: Payer: BC Managed Care – PPO

## 2018-06-06 ENCOUNTER — Other Ambulatory Visit: Payer: BC Managed Care – PPO

## 2018-08-03 ENCOUNTER — Encounter: Payer: BC Managed Care – PPO | Admitting: Family Medicine

## 2018-08-30 ENCOUNTER — Encounter: Payer: BC Managed Care – PPO | Admitting: Family Medicine

## 2018-10-20 ENCOUNTER — Ambulatory Visit (INDEPENDENT_AMBULATORY_CARE_PROVIDER_SITE_OTHER): Payer: BC Managed Care – PPO | Admitting: Family Medicine

## 2018-10-20 ENCOUNTER — Ambulatory Visit: Payer: Self-pay

## 2018-10-20 ENCOUNTER — Encounter: Payer: Self-pay | Admitting: Family Medicine

## 2018-10-20 ENCOUNTER — Other Ambulatory Visit: Payer: Self-pay

## 2018-10-20 VITALS — Temp 98.5°F

## 2018-10-20 DIAGNOSIS — N50812 Left testicular pain: Secondary | ICD-10-CM

## 2018-10-20 NOTE — Telephone Encounter (Signed)
Scheduled today virtual at 4

## 2018-10-20 NOTE — Telephone Encounter (Signed)
Pt. Reports he started noticing left testicular pain this past Monday. Seems to be more painful with clothing on. No swelling or redness. No urinary symptoms or fever. Requests a virtual visit if possible. No answer at the practice. Please advise pt. He will see who ever is available. Answer Assessment - Initial Assessment Questions 1. LOCATION and RADIATION: "Where is the pain located?"      Left testicle 2. QUALITY: "What does the pain feel like?"  (e.g., sharp, dull, aching, burning)     Sharp 3. SEVERITY: "How bad is the pain?"  (Scale 1-10; or mild, moderate, severe)   - MILD (1-3): doesn't interfere with normal activities    - MODERATE (4-7): interferes with normal activities (e.g., work or school) or awakens from sleep   - SEVERE (8-10): excruciating pain, unable to do any normal activities, difficulty walking       9 4. ONSET: "When did the pain start?"     Monday 5. PATTERN: "Does it come and go, or has it been constant since it started?"     Comes and goes 6. SCROTAL APPEARANCE: "What does the scrotum look like?" "Is there any swelling or redness?"      No 7. HERNIA: "Has a doctor ever told you that you have a hernia?"     No 8. OTHER SYMPTOMS: "Do you have any other symptoms?" (e.g., fever, abdominal pain, vomiting, difficulty passing urine)     No  Protocols used: SCROTAL PAIN-A-AH

## 2018-10-20 NOTE — Progress Notes (Signed)
Chief Complaint  Patient presents with  . Testicle Pain    Subjective: Patient is a 63 y.o. male here for testicle pain. Due to COVID-19 pandemic, we are interacting via telephone. I verified patient's ID using 2 identifiers. Patient agreed to proceed with visit via this method. Patient is at home, I am at office. Patient and I are present for visit.   5 d of slight pain in L testicle. It got worse over the week. Swollen as well.  He was intimate with his wife today and iced it afterwards.  His pain got significantly better.  He does not report any injury.  He does have a leg brace and has been walking more often lately.  No fevers, skin lesions, pain with urination, or abdominal pain.   ROS:  GU: As noted in HPI  Past Medical History:  Diagnosis Date  . HTN (hypertension)   . Osteoarthritis    Right leg  . Osteomyelitis of left leg (HCC)   . Post-polio syndrome 1958   Right Leg    Objective: Temp 98.5 F (36.9 C) (Oral)  No conversational dyspnea Age appropriate judgment and insight Nml affect and mood  Assessment and Plan: Left testicular pain   Supportive undergarments, ibuprofen, Tylenol, ice.  Let us know if things do not get better. Total time spent: 12 minutes 45 seconds Follow-up as needed. The patient voiced understanding and agreement to the plan.  [place], DO 10/20/18  3:48 PM

## 2018-10-20 NOTE — Telephone Encounter (Signed)
Changed to 3:30 virtual

## 2018-11-08 ENCOUNTER — Encounter: Payer: BC Managed Care – PPO | Admitting: Family Medicine

## 2019-05-30 ENCOUNTER — Other Ambulatory Visit: Payer: Self-pay

## 2019-05-30 ENCOUNTER — Ambulatory Visit (INDEPENDENT_AMBULATORY_CARE_PROVIDER_SITE_OTHER): Payer: Medicare PPO | Admitting: Internal Medicine

## 2019-05-30 ENCOUNTER — Ambulatory Visit (HOSPITAL_BASED_OUTPATIENT_CLINIC_OR_DEPARTMENT_OTHER)
Admission: RE | Admit: 2019-05-30 | Discharge: 2019-05-30 | Disposition: A | Discharge status: Home / Self Care | Payer: Medicare PPO | Source: Ambulatory Visit | Attending: Internal Medicine | Admitting: Internal Medicine

## 2019-05-30 ENCOUNTER — Encounter: Payer: Self-pay | Admitting: Internal Medicine

## 2019-05-30 VITALS — BP 196/79 | HR 61 | Temp 97.7°F | Resp 18 | Ht 67.0 in | Wt 195.1 lb

## 2019-05-30 DIAGNOSIS — M25571 Pain in right ankle and joints of right foot: Secondary | ICD-10-CM

## 2019-05-30 DIAGNOSIS — I10 Essential (primary) hypertension: Secondary | ICD-10-CM | POA: Diagnosis not present

## 2019-05-30 MED ORDER — LOSARTAN POTASSIUM 50 MG PO TABS: ORAL_TABLET | ORAL | 0 refills | Status: DC

## 2019-05-30 MED ORDER — DOXYCYCLINE HYCLATE 100 MG PO TABS: 100.0000 mg | ORAL_TABLET | Freq: Two times a day (BID) | ORAL | 0 refills | Status: DC

## 2019-05-30 MED ORDER — AMLODIPINE BESYLATE 2.5 MG PO TABS: 2.5000 mg | ORAL_TABLET | Freq: Every day | ORAL | 0 refills | Status: DC

## 2019-05-30 NOTE — Patient Instructions (Signed)
Go back on your two  blood pressure medicines.  We sent a 3-week supply Check your blood pressures twice a week BP GOAL is between 110/65 and  135/85. If it is consistently higher or lower, let me know  Start doxycycline, an antibiotic.   We are referring you to the orthopedic doctor.  If you have fever, chills, increased pain or swelling at the right ankle please let us know  GO TO THE FRONT DESK, PLEASE SCHEDULE YOUR APPOINTMENTS Come back for   blood work this week  Come back for to see Dr. Carmelia Roller in 2 weeks  STOP BY THE FIRST FLOOR:  get the XR

## 2019-05-30 NOTE — Progress Notes (Signed)
Subjective:    Patient ID: Mark Humphrey, male    DOB: [birth year], 64 y.o.   MRN: [id number]  DOS:  05/30/2019 Type of visit - description: Acute visit 3 months history of pain, swelling and redness at the right ankle. Putting pressure on the area  significantly increases his symptoms. He has a history of osteomyelitis, see a summary of the last visit to ID at [facility] in 2017.  Also, BP was noted to be elevated, reports he has not taken his medications in about a year  03/2015 [facility] I.D. consult  64 y.o. man with a hx of R-leg residual atrophy from polio and L-distal femur chronic osteomyelitis. Today we had a long discussion about his chronic osteomyelitis. I told him that this is indeed an infection (not gout which was diagnosed in his knee 09/2013); however, unlike an acute infection, there is no need for urgent surgery with chronic osteo. It appears that he has good function of his L leg (golfing daily) and no pain. Nevertheless, I think that given this long-standing infection and now persistent drainage, I think he should get an opinion regarding surgical options for curative treatment. I emphasized that antibiotics alone would never cure this infection, and that he would need both surgical debridement followed by antibiotics for any chance of a cure. I also stated that I cannot predict how slowly or quickly this infection could spread. He has a Doctor, general practice in [place], but I think that coordination of care would be better if both his ID and ortho physicians were in the same location. He agreed to getting a surgical consultation here at [facility]. He will get updated xrays today. Then I will refer him to [place].   As for the clindamycin, since he is having drainage despite the clindamycin, I recommended stopping the clinda since it certainly is not curative and the risks of AEs (e.g., CDiff) would outweigh any potential benefit. He will follow-up with me in 2 months or at a time  appropriate per any surgical plans.    BP Readings from Last 3 Encounters:  05/30/19 (!) 196/79  02/02/18 (!) 142/82  11/23/17 (!) 150/80    Review of Systems Denies fever chills No headache, nochest pain or difficulty breathing. He has chronic discharge from the left knee, unchanged for years  Past Medical History:  Diagnosis Date  . HTN (hypertension)   . Osteoarthritis    Right leg  . Osteomyelitis of left leg (HCC)   . Post-polio syndrome 1958   Right Leg    Past Surgical History:  Procedure Laterality Date  . BONE MARROW ASPIRATION     Right knee  . Post Polio Surgery  1958   Bone fusing w/hardware  . WISDOM TOOTH EXTRACTION      Allergies as of 05/30/2019   No Known Allergies     Medication List       Accurate as of May 30, 2019  4:35 PM. If you have any questions, ask your nurse or doctor.        amLODipine 2.5 MG tablet Commonly known as: NORVASC Take 2.5 mg by mouth daily.   losartan 50 MG tablet Commonly known as: COZAAR TAKE 1 TABLET(50 MG) BY MOUTH DAILY   Vitamin D (Ergocalciferol) 1.25 MG (50000 UNIT) Caps capsule Commonly known as: DRISDOL TAKE 1 CAPSULE BY MOUTH EVERY WEEK          Objective:   Physical Exam BP (!) 196/79 (BP Location: Left Arm, Patient Position:  Sitting, Cuff Size: Normal)   Pulse 61   Temp 97.7 F (36.5 C) (Temporal)   Resp 18   Ht 5\' 7"  (1.702 m)   Wt 195 lb 2 oz (88.5 kg)   SpO2 100%   BMI 30.56 kg/m  General:   Well developed, NAD, BMI noted. HEENT:  Normocephalic . Face symmetric, atraumatic Lungs:  CTA B Normal respiratory effort, no intercostal retractions, no accessory muscle use. Heart: RRR,  no murmur.  MSK: Proximal from the left knee he has opening with purulent discharge. Right ankle and foot: It is TTP around the ankle, +red and slightly warm.  + Swelling in the area. Skin: Not pale. Not jaundice Neurologic:  alert & oriented X3.  Speech normal, gait assisted by crutches and  consistent with history of polio Psych--  Cognition and judgment appear intact.  Cooperative with normal attention span and concentration.  Behavior appropriate. No anxious or depressed appearing.      Assessment     64 year old gentleman, PMH includes HTN, postpolio syndrome, gout, H/O left femur osteomyelitis, used to be seen at [facility].  Presents with:  Right ankle pain: Going on for 3 months, exam is consistent with inflammatory process at the ankle, osteomyelitis? Plan: X-ray, doxycycline for 10 days, refer to Ortho.  Call if fever chills or worsening symptoms. HTN: BP elevated, patient quit medications about a year ago, he is asymptomatic, I will refill 3-week supply of medicines, check a CMP and CBC and asked to see PCP in 2 weeks. See AVS    This visit occurred during the SARS-CoV-2 public health emergency.  Safety protocols were in place, including screening questions prior to the visit, additional usage of staff PPE, and extensive cleaning of exam room while observing appropriate contact time as indicated for disinfecting solutions.

## 2019-05-30 NOTE — Progress Notes (Signed)
Pre visit review using our clinic review tool, if applicable. No additional management support is needed unless otherwise documented below in the visit note. 

## 2019-05-31 ENCOUNTER — Telehealth: Payer: Self-pay

## 2019-05-31 ENCOUNTER — Encounter: Payer: Self-pay | Admitting: Internal Medicine

## 2019-05-31 NOTE — Telephone Encounter (Signed)
Patient called in to speak with the nurse or the doctor about his X-Ray results Please follow up with the patient as soon as possible at [phone]

## 2019-05-31 NOTE — Telephone Encounter (Signed)
Received mychart message this morning- had informed him radiology has not read result yet.

## 2019-06-01 ENCOUNTER — Other Ambulatory Visit: Payer: Medicare PPO

## 2019-06-13 ENCOUNTER — Ambulatory Visit: Payer: Medicare PPO | Admitting: Family Medicine

## 2019-06-17 ENCOUNTER — Other Ambulatory Visit: Payer: Self-pay | Admitting: Internal Medicine

## 2019-06-18 NOTE — Telephone Encounter (Signed)
Last refill done by Dr. Drue Novel?

## 2019-07-10 ENCOUNTER — Other Ambulatory Visit: Payer: Self-pay | Admitting: Internal Medicine

## 2019-08-21 ENCOUNTER — Other Ambulatory Visit: Payer: Self-pay | Admitting: Family Medicine

## 2019-10-12 ENCOUNTER — Other Ambulatory Visit: Payer: Self-pay | Admitting: Internal Medicine

## 2020-01-02 ENCOUNTER — Other Ambulatory Visit: Payer: Self-pay | Admitting: Family Medicine

## 2020-01-28 ENCOUNTER — Other Ambulatory Visit: Payer: Self-pay | Admitting: Family Medicine

## 2020-04-03 ENCOUNTER — Other Ambulatory Visit: Payer: Self-pay | Admitting: Family Medicine

## 2020-07-07 ENCOUNTER — Other Ambulatory Visit: Payer: Self-pay | Admitting: Family Medicine

## 2020-07-16 ENCOUNTER — Other Ambulatory Visit: Payer: Self-pay | Admitting: Family Medicine

## 2020-07-27 ENCOUNTER — Other Ambulatory Visit: Payer: Self-pay | Admitting: Family Medicine

## 2020-08-12 ENCOUNTER — Other Ambulatory Visit: Payer: Self-pay | Admitting: Family Medicine

## 2020-09-12 ENCOUNTER — Encounter: Payer: Self-pay | Admitting: Family Medicine

## 2020-09-12 ENCOUNTER — Ambulatory Visit (INDEPENDENT_AMBULATORY_CARE_PROVIDER_SITE_OTHER): Payer: Medicare PPO | Admitting: Family Medicine

## 2020-09-12 ENCOUNTER — Other Ambulatory Visit: Payer: Self-pay

## 2020-09-12 ENCOUNTER — Other Ambulatory Visit: Payer: Self-pay | Admitting: Internal Medicine

## 2020-09-12 VITALS — BP 142/82 | HR 56 | Temp 98.1°F | Ht 67.0 in | Wt 200.0 lb

## 2020-09-12 DIAGNOSIS — G14 Postpolio syndrome: Secondary | ICD-10-CM

## 2020-09-12 DIAGNOSIS — E559 Vitamin D deficiency, unspecified: Secondary | ICD-10-CM | POA: Diagnosis not present

## 2020-09-12 DIAGNOSIS — M8660 Other chronic osteomyelitis, unspecified site: Secondary | ICD-10-CM

## 2020-09-12 MED ORDER — DOXYCYCLINE HYCLATE 100 MG PO TABS: 100.0000 mg | ORAL_TABLET | Freq: Two times a day (BID) | ORAL | 0 refills | Status: DC

## 2020-09-12 MED ORDER — CLINDAMYCIN HCL 150 MG PO CAPS: 150.0000 mg | ORAL_CAPSULE | Freq: Four times a day (QID) | ORAL | 0 refills | Status: AC

## 2020-09-12 NOTE — Patient Instructions (Signed)
If you do not hear anything about your referral in the next 1-2 weeks, call our office and ask for an update.  Take a probiotic with your clindamycin.   If anything changes with your left thigh, please let me know or seek immediate care.  Let us know if you need anything.

## 2020-09-12 NOTE — Progress Notes (Signed)
Chief Complaint  Patient presents with   Follow-up    Handicap form completed New prescription for Full length leg brace Antibiotic     Subjective: Patient is a 65 y.o. male here for fu.  Post polio syndrome-patient with history of polio affecting his right lower extremity.  He uses a leg brace to help with ambulation.  He is requesting a written order for this.  Chronic osteomyelitis-patient has a 56-year history of chronic osteomyelitis.  He will go on antibiotics such as doxycycline or clindamycin intermittently.  He has been told by several infectious disease specialist, most recently at [facility] in 2017, that he needs surgical debridement and possible IV antibiotics following that.  He does have chronic drainage but has not lately had pain, redness, or swelling.  There is no foul odor and he denies any fevers.   Past Medical History:  Diagnosis Date   HTN (hypertension)    Osteoarthritis    Right leg   Osteomyelitis of left leg (HCC)    Post-polio syndrome 1958   Right Leg    Objective: BP (!) 142/82   Pulse (!) 56   Temp 98.1 F (36.7 C) (Oral)   Ht 5\' 7"  (1.702 m)   Wt 200 lb (90.7 kg)   SpO2 99%   BMI 31.32 kg/m  General: Awake, appears stated age HEENT: MMM, EOMi Heart: Reg rhythm, bradycardic, no LE edema MSK: bandage over distal medial L thigh, no ttp over L femur. Neuro: Walks w limp favoring RLE Lungs: CTAB, no rales, wheezes or rhonchi. No accessory muscle use Psych: Age appropriate judgment and insight, normal affect and mood  Assessment and Plan: Post-polio syndrome - Plan: For home use only DME Other see comment  Chronic osteomyelitis (HCC) - Plan: Ambulatory referral to Infectious Disease  Vitamin D deficiency  Chronic, stable. Will write rx for full R leg brace. Chronic, unstable. Refer ID. Saw [facility] in 2017. We will re-rx Clinda 150 qid for 10 d. Offered referral to ortho, declined at this time. He asked about chronic Clinda use, I declined  stating he would need to discuss w ID.  Will ck Vit D at CPE in 6 mo. Declined labs today. Cont OTC supp.  The patient voiced understanding and agreement to the plan.  2018 [place], DO 09/12/20  9:11 AM

## 2020-09-21 ENCOUNTER — Other Ambulatory Visit: Payer: Self-pay | Admitting: Family Medicine

## 2020-09-29 ENCOUNTER — Ambulatory Visit: Payer: Medicare PPO | Admitting: Internal Medicine

## 2020-10-07 ENCOUNTER — Ambulatory Visit: Payer: Medicare PPO

## 2020-10-09 ENCOUNTER — Ambulatory Visit: Payer: Medicare PPO | Admitting: Infectious Disease

## 2020-12-01 DIAGNOSIS — G14 Postpolio syndrome: Secondary | ICD-10-CM | POA: Diagnosis not present

## 2021-02-19 ENCOUNTER — Other Ambulatory Visit: Payer: Self-pay | Admitting: Family Medicine

## 2021-02-19 DIAGNOSIS — E559 Vitamin D deficiency, unspecified: Secondary | ICD-10-CM

## 2021-02-19 MED ORDER — LOSARTAN POTASSIUM 50 MG PO TABS: 50.0000 mg | ORAL_TABLET | Freq: Every day | ORAL | 0 refills | Status: DC

## 2021-02-19 MED ORDER — AMLODIPINE BESYLATE 2.5 MG PO TABS: 2.5000 mg | ORAL_TABLET | Freq: Every day | ORAL | 0 refills | Status: DC

## 2021-03-13 ENCOUNTER — Encounter: Payer: Medicare PPO | Admitting: Family Medicine

## 2021-03-20 ENCOUNTER — Encounter: Payer: Self-pay | Admitting: Family Medicine

## 2021-03-20 MED ORDER — LOSARTAN POTASSIUM 50 MG PO TABS: 50.0000 mg | ORAL_TABLET | Freq: Every day | ORAL | 0 refills | Status: DC

## 2021-03-20 MED ORDER — AMLODIPINE BESYLATE 2.5 MG PO TABS: 2.5000 mg | ORAL_TABLET | Freq: Every day | ORAL | 0 refills | Status: DC

## 2021-04-17 ENCOUNTER — Other Ambulatory Visit: Payer: Self-pay | Admitting: Family Medicine

## 2021-04-27 ENCOUNTER — Ambulatory Visit (INDEPENDENT_AMBULATORY_CARE_PROVIDER_SITE_OTHER): Payer: Medicare PPO | Admitting: Family Medicine

## 2021-04-27 ENCOUNTER — Encounter: Payer: Self-pay | Admitting: Family Medicine

## 2021-04-27 ENCOUNTER — Other Ambulatory Visit: Payer: Self-pay | Admitting: Family Medicine

## 2021-04-27 ENCOUNTER — Other Ambulatory Visit (INDEPENDENT_AMBULATORY_CARE_PROVIDER_SITE_OTHER): Payer: Medicare PPO

## 2021-04-27 VITALS — BP 138/86 | HR 55 | Temp 98.0°F | Ht 67.0 in | Wt 197.2 lb

## 2021-04-27 DIAGNOSIS — Z125 Encounter for screening for malignant neoplasm of prostate: Secondary | ICD-10-CM | POA: Diagnosis not present

## 2021-04-27 DIAGNOSIS — R739 Hyperglycemia, unspecified: Secondary | ICD-10-CM | POA: Diagnosis not present

## 2021-04-27 DIAGNOSIS — E559 Vitamin D deficiency, unspecified: Secondary | ICD-10-CM

## 2021-04-27 DIAGNOSIS — Z0000 Encounter for general adult medical examination without abnormal findings: Secondary | ICD-10-CM

## 2021-04-27 DIAGNOSIS — G14 Postpolio syndrome: Secondary | ICD-10-CM

## 2021-04-27 DIAGNOSIS — Z1211 Encounter for screening for malignant neoplasm of colon: Secondary | ICD-10-CM

## 2021-04-27 DIAGNOSIS — I10 Essential (primary) hypertension: Secondary | ICD-10-CM

## 2021-04-27 DIAGNOSIS — M8660 Other chronic osteomyelitis, unspecified site: Secondary | ICD-10-CM

## 2021-04-27 DIAGNOSIS — Z23 Encounter for immunization: Secondary | ICD-10-CM

## 2021-04-27 DIAGNOSIS — Z1159 Encounter for screening for other viral diseases: Secondary | ICD-10-CM | POA: Diagnosis not present

## 2021-04-27 DIAGNOSIS — E669 Obesity, unspecified: Secondary | ICD-10-CM | POA: Diagnosis not present

## 2021-04-27 DIAGNOSIS — R945 Abnormal results of liver function studies: Secondary | ICD-10-CM

## 2021-04-27 LAB — COMPREHENSIVE METABOLIC PANEL
ALT: 13 U/L (ref 0–53)
AST: 15 U/L (ref 0–37)
Albumin: 4 g/dL (ref 3.5–5.2)
Alkaline Phosphatase: 121 U/L — ABNORMAL HIGH (ref 39–117)
BUN: 20 mg/dL (ref 6–23)
CO2: 26 mEq/L (ref 19–32)
Calcium: 8.9 mg/dL (ref 8.4–10.5)
Chloride: 105 mEq/L (ref 96–112)
Creatinine, Ser: 0.82 mg/dL (ref 0.40–1.50)
GFR: 91.12 mL/min (ref >60.00)
Glucose, Bld: 117 mg/dL — ABNORMAL HIGH (ref 70–99)
Potassium: 4.7 mEq/L (ref 3.5–5.1)
Sodium: 137 mEq/L (ref 135–145)
Total Bilirubin: 0.4 mg/dL (ref 0.2–1.2)
Total Protein: 7.1 g/dL (ref 6.0–8.3)

## 2021-04-27 LAB — PSA: PSA: 1.15 ng/mL (ref 0.10–4.00)

## 2021-04-27 LAB — LIPID PANEL
Cholesterol: 132 mg/dL (ref 0–200)
HDL: 43.1 mg/dL (ref >39.00)
LDL Cholesterol: 75 mg/dL (ref 0–99)
NonHDL: 89.12
Total CHOL/HDL Ratio: 3
Triglycerides: 73 mg/dL (ref 0.0–149.0)
VLDL: 14.6 mg/dL (ref 0.0–40.0)

## 2021-04-27 LAB — CBC
HCT: 39.5 % (ref 39.0–52.0)
Hemoglobin: 13.4 g/dL (ref 13.0–17.0)
MCHC: 33.9 g/dL (ref 30.0–36.0)
MCV: 89.2 fl (ref 78.0–100.0)
Platelets: 263 10*3/uL (ref 150.0–400.0)
RBC: 4.43 Mil/uL (ref 4.22–5.81)
RDW: 14.3 % (ref 11.5–15.5)
WBC: 4.9 10*3/uL (ref 4.0–10.5)

## 2021-04-27 LAB — VITAMIN D 25 HYDROXY (VIT D DEFICIENCY, FRACTURES): VITD: 19.79 ng/mL — ABNORMAL LOW (ref 30.00–100.00)

## 2021-04-27 LAB — HEMOGLOBIN A1C: Hgb A1c MFr Bld: 6 % (ref 4.6–6.5)

## 2021-04-27 MED ORDER — VITAMIN D (ERGOCALCIFEROL) 1.25 MG (50000 UNIT) PO CAPS: 50000.0000 [IU] | ORAL_CAPSULE | ORAL | 0 refills | Status: DC

## 2021-04-27 NOTE — Progress Notes (Signed)
Chief Complaint  ?Patient presents with  ? Follow-up  ? ? ?Well Male ?Mark Humphrey is here for a complete physical.   ?His last physical was >1 year ago.  ?Current diet: in general, a "healthy" diet.   ?Current exercise: walking, golfing ?Weight trend: stable ?Fatigue out of ordinary? No. ?Seat belt? Yes.   ?Advanced directive? No ? ?Health maintenance ?Shingrix- No ?Colonoscopy- No ?Tetanus- Yes ?Hep C- No ?Pneumonia vaccine- No ? ?Past Medical History:  ?Diagnosis Date  ? HTN (hypertension)   ? Osteoarthritis   ? Right leg  ? Osteomyelitis of left leg (HCC)   ? Post-polio syndrome 1958  ? Right Leg  ?  ? ?Past Surgical History:  ?Procedure Laterality Date  ? BONE MARROW ASPIRATION    ? Right knee  ? Post Polio Surgery  1958  ? Bone fusing w/hardware  ? WISDOM TOOTH EXTRACTION    ? ? ?Medications  ?Current Outpatient Medications on File Prior to Visit  ?Medication Sig Dispense Refill  ? amLODipine (NORVASC) 2.5 MG tablet TAKE 1 TABLET(2.5 MG) BY MOUTH DAILY 90 tablet 0  ? ?Allergies ?No Known Allergies ? ?Family History ?Family History  ?Problem Relation Age of Onset  ? Arthritis Father   ?     Deceased  ? Heart disease Mother   ?     Deceased  ? Hypertension Mother   ? Lung cancer Mother   ? Lung cancer Other   ?     Aunts x3  ? Healthy Sister   ?     x1  ? COPD Sister   ? Healthy Brother   ?     x2  ? ? ?Review of Systems: ?Constitutional:  no fevers ?Eye:  no recent significant change in vision ?Ears:  No changes in hearing ?Nose/Mouth/Throat:  no complaints of nasal congestion, no sore throat ?Cardiovascular: no chest pain ?Respiratory:  No shortness of breath ?Gastrointestinal:  No change in bowel habits ?GU:  No frequency ?Integumentary:  no new abnormal skin lesions reported ?Neurologic:  no headaches ?Endocrine:  denies unexplained weight changes ? ?Exam ?BP 138/86   Pulse (!) 55   Temp 98 ?F (36.7 ?C) (Oral)   Ht 5\' 7"  (1.702 m)   Wt 197 lb 4 oz (89.5 kg)   SpO2 99%   BMI 30.89 kg/m?  ?General:   well developed, well nourished, in no apparent distress ?Skin:  no significant moles, warts, or growths ?Head:  no masses, lesions, or tenderness ?Eyes:  pupils equal and round, sclera anicteric without injection ?Ears:  canals without lesions, TMs shiny without retraction, no obvious effusion, no erythema ?Nose:  nares patent, septum midline, mucosa normal ?Throat/Pharynx:  lips and gingiva without lesion; tongue and uvula midline; non-inflamed pharynx; no exudates or postnasal drainage ?Lungs:  clear to auscultation, breath sounds equal bilaterally, no respiratory distress ?Cardio:  regular rate and rhythm, no LE edema or bruits ?Rectal: Deferred ?GI: BS+, S, NT, ND, no masses or organomegaly ?Musculoskeletal:  atrophy of RLE compared to rest of body ?Neuro:  Gait limping favoring RLE with 0/5 strength, 5/5 strength throughout over extremities ?Psych: well oriented with normal range of affect and appropriate judgment/insight ? ?Assessment and Plan ? ?Well adult exam ? ?Vitamin D deficiency - Plan: VITAMIN D 25 Hydroxy (Vit-D Deficiency, Fractures) ? ?Obesity (BMI 30-39.9) ? ?Chronic osteomyelitis (HCC) - Plan: Ambulatory referral to Infectious Disease ? ?Screening for prostate cancer - Plan: PSA ? ?Encounter for hepatitis C screening test for low risk  patient - Plan: Hepatitis C antibody ? ?Essential hypertension - Plan: CBC, Comprehensive metabolic panel, Lipid panel ? ?Screen for colon cancer - Plan: Cologuard ? ?Need for vaccination against Streptococcus pneumoniae - Plan: Pneumococcal conjugate vaccine 20-valent (Prevnar 20) ? ?Post-polio syndrome  ? ?Well 66 y.o. male. ?Counseled on diet and exercise. ?Other orders as above. ?Advanced directive form provided today.  ?Chronic osteo: Hx of post-polio syndrome affecting RLE, hx of chronic osteomyelitis on LLE. Still draining without pain or fevers. Was referred to ID but was too scared to go fearing what they would tell him. Is now OK to see them, will refer  back.  ?CCS: will set up Cologard ?PCV20 today. Shingrix rec'd.  ?Screen Hep C and for prostate cancer.  ?Follow up in 6 mo or prn.  ?The patient voiced understanding and agreement to the plan. ? ?Sharlene Dory, DO ?04/27/21 ?7:59 AM ? ?

## 2021-04-27 NOTE — Patient Instructions (Addendum)
Give [place] 2-3 business days to get the results of your labs back.  ? ?If you do not hear anything about your referral in the next 1-2 weeks, call our office and ask for an update. ? ?Keep the diet clean and stay active. ? ?Advanced directive form provided today.  ? ?The Shingrix vaccine (for shingles) is a 2 shot series spaced 2-6 months apart. It can make people feel low energy, achy and almost like they have the flu for 48 hours after injection. 1/5 people can have nausea and/or vomiting. Please plan accordingly when deciding on when to get this shot. Call your pharmacy to get this. The second shot of the series is less severe regarding the side effects, but it still lasts 48 hours.  ? ?Use some Flonase for your ear.  ? ?Let us know if you need anything. ?

## 2021-04-28 LAB — HEPATITIS C ANTIBODY
Hepatitis C Ab: NONREACTIVE
SIGNAL TO CUT-OFF: 0.05 (ref <1.00)

## 2021-04-30 ENCOUNTER — Encounter: Payer: Self-pay | Admitting: Internal Medicine

## 2021-04-30 ENCOUNTER — Other Ambulatory Visit: Payer: Self-pay | Admitting: Family Medicine

## 2021-04-30 ENCOUNTER — Ambulatory Visit (INDEPENDENT_AMBULATORY_CARE_PROVIDER_SITE_OTHER): Payer: Medicare PPO | Admitting: Internal Medicine

## 2021-04-30 ENCOUNTER — Other Ambulatory Visit: Payer: Self-pay

## 2021-04-30 ENCOUNTER — Encounter: Payer: Self-pay | Admitting: Family Medicine

## 2021-04-30 ENCOUNTER — Ambulatory Visit: Payer: Medicare PPO | Admitting: Internal Medicine

## 2021-04-30 VITALS — BP 215/80 | HR 62 | Temp 98.2°F | Resp 16 | Ht 67.0 in | Wt 204.0 lb

## 2021-04-30 DIAGNOSIS — M8660 Other chronic osteomyelitis, unspecified site: Secondary | ICD-10-CM

## 2021-04-30 NOTE — Progress Notes (Signed)
?  [place] [facility]  ? ? ? ? ?Reason for Consult: chronic osteomyelitis    ?Referring Physician: Dr. Nani Ravens ? ? ? Patient ID: Mark Humphrey, male    DOB: 1955-03-26, 66 y.o.   MRN: [id number] ? ?HPI:   ?He is here for evaluation for his chronic osteomyelitis of the left leg.   ?He has a history of left leg osteomyelitis of the left lateral femur in 2002 treated at [facility] with prolonged oral dicloxacillin which he took for about 6 weeks then cut short his duration and did not follow up after that.  Culture at that time was reported to be MSSA.  He did well for a while but then developed a draining sinus of his left medial distal femur that has been chronic for years.  He has periodically taken dicloxacillin for this and does close up sometimes but comes back again.  He has been hesitant to see an orthopedic surgeon since he is afraid of getting surgery based on what he sees on Google.  He previously followed ID at [facility] and last saw them in 2017.  They recommended a surgical evaluation since antibiotics alone would not cure this.  He has not followed up with anyone since that time. He remains reluctant to see an orthopedic surgeon.   ?Records reviewed at [facility].   ?He asks me if there is anything 'new' in regards to non-operative treatment for this.  ? ?Past Medical History:  ?Diagnosis Date  ? HTN (hypertension)   ? Osteoarthritis   ? Right leg  ? Osteomyelitis of left leg (HCC)   ? Post-polio syndrome 1958  ? Right Leg  ? ? ?Prior to Admission medications   ?Medication Sig Start Date End Date Taking? Authorizing Provider  ?amLODipine (NORVASC) 2.5 MG tablet TAKE 1 TABLET(2.5 MG) BY MOUTH DAILY 04/20/21  Yes Shelda Pal, DO  ?Vitamin D, Ergocalciferol, (DRISDOL) 1.25 MG (50000 UNIT) CAPS capsule Take 1 capsule (50,000 Units total) by mouth every 7 (seven) days. 04/27/21  Yes Shelda Pal, DO  ? ? ?No Known Allergies ? ?Social History  ? ?Tobacco Use  ? Smoking status: Never  ?  Smokeless tobacco: Never  ?Vaping Use  ? Vaping Use: Never used  ?Substance Use Topics  ? Alcohol use: Yes  ?  Alcohol/week: 8.0 standard drinks  ?  Types: 8 Shots of liquor per week  ? Drug use: No  ? ? ?Family History  ?Problem Relation Age of Onset  ? Arthritis Father   ?     Deceased  ? Heart disease Mother   ?     Deceased  ? Hypertension Mother   ? Lung cancer Mother   ? Lung cancer Other   ?     Aunts x3  ? Healthy Sister   ?     x1  ? COPD Sister   ? Healthy Brother   ?     x2  ? ? ?Review of Systems ? Constitutional: negative for fevers and chills ?Gastrointestinal: negative for nausea and diarrhea ?All other systems reviewed and are negative   ? ?Constitutional: in no apparent distress  ?Vitals:  ? 04/30/21 1012  ?BP: (!) 215/80  ?Pulse: 62  ?Resp: 16  ?Temp: 98.2 ?F (36.8 ?C)  ?SpO2: 99%  ? ?EYES: anicteric ?Respiratory: normal respiratory effort ?Musculoskeletal: left leg, medial, with a small track with blood tinged pus drainage.  No surrounding erythema, no tenderness, no swelling ?Skin: no rash ? ?Labs: ?  Lab Results  ?Component Value Date  ? WBC 4.9 04/27/2021  ? HGB 13.4 04/27/2021  ? HCT 39.5 04/27/2021  ? MCV 89.2 04/27/2021  ? PLT 263.0 04/27/2021  ?  ?Lab Results  ?Component Value Date  ? CREATININE 0.82 04/27/2021  ? BUN 20 04/27/2021  ? NA 137 04/27/2021  ? K 4.7 04/27/2021  ? CL 105 04/27/2021  ? CO2 26 04/27/2021  ?  ?Lab Results  ?Component Value Date  ? ALT 13 04/27/2021  ? AST 15 04/27/2021  ? ALKPHOS 121 (H) 04/27/2021  ? BILITOT 0.4 04/27/2021  ?  ? ?Assessment: chronic osteomyelitis.  This has been ongoing for a prolonged period and he has been reluctant to have surgery.  I do not have anything different to add to his previous infectious disease recommendations done at [facility].  I reiterated that this is a surgical issue and antibiotics can be helpful after surgical management to get to cure.  He has not had a recent xray or MRI but this will only be helpful with a surgical opinion so I  did not order an xray.  ?At this point, I offered a referral but he remained reluctant.  ?I reiterated that there is no indication for antibiotics without surgical management.    ? ?Plan: ?1)  discuss with your PCP if you would like a referral to orthopedic surgery.  ? ?Otherwise, no follow up indicated.   ?

## 2021-05-04 ENCOUNTER — Encounter: Payer: Self-pay | Admitting: Family Medicine

## 2021-06-11 ENCOUNTER — Encounter: Payer: Self-pay | Admitting: *Deleted

## 2021-06-16 ENCOUNTER — Other Ambulatory Visit: Payer: Self-pay | Admitting: Family Medicine

## 2021-07-01 ENCOUNTER — Encounter (INDEPENDENT_AMBULATORY_CARE_PROVIDER_SITE_OTHER): Payer: Medicare PPO | Admitting: Family Medicine

## 2021-07-01 DIAGNOSIS — M25579 Pain in unspecified ankle and joints of unspecified foot: Secondary | ICD-10-CM

## 2021-07-01 NOTE — Telephone Encounter (Signed)
Please see the MyChart message reply(ies) for my assessment and plan.  The patient gave consent for this Medical Advice Message and is aware that it may result in a bill to their insurance company as well as the possibility that this may result in a co-payment or deductible. They are an established patient, but are not seeking medical advice exclusively about a problem treated during an in person or video visit in the last 7 days. I did not recommend an in person or video visit within 7 days of my reply.  I spent a total of 6 minutes cumulative time within 7 days through MyChart messaging Saben Donigan Paul Clorissa Gruenberg, DO  

## 2021-07-11 ENCOUNTER — Other Ambulatory Visit: Payer: Self-pay | Admitting: Family Medicine

## 2021-07-29 ENCOUNTER — Telehealth: Payer: Self-pay | Admitting: Family Medicine

## 2021-07-29 NOTE — Telephone Encounter (Signed)
Left message for patient to call back and schedule Medicare Annual Wellness Visit (AWV).   Please offer to do virtually or by telephone.  Left office number and my jabber [phone].  AWVI eligible as of  02/12/2020  Please schedule at anytime with Nurse Health Advisor.

## 2021-07-31 ENCOUNTER — Other Ambulatory Visit (INDEPENDENT_AMBULATORY_CARE_PROVIDER_SITE_OTHER): Payer: Medicare PPO

## 2021-07-31 DIAGNOSIS — E559 Vitamin D deficiency, unspecified: Secondary | ICD-10-CM

## 2021-07-31 DIAGNOSIS — R945 Abnormal results of liver function studies: Secondary | ICD-10-CM

## 2021-07-31 LAB — HEPATIC FUNCTION PANEL
ALT: 10 U/L (ref 0–53)
AST: 13 U/L (ref 0–37)
Albumin: 4.2 g/dL (ref 3.5–5.2)
Alkaline Phosphatase: 115 U/L (ref 39–117)
Bilirubin, Direct: 0.1 mg/dL (ref 0.0–0.3)
Total Bilirubin: 0.5 mg/dL (ref 0.2–1.2)
Total Protein: 7.1 g/dL (ref 6.0–8.3)

## 2021-07-31 LAB — VITAMIN D 25 HYDROXY (VIT D DEFICIENCY, FRACTURES): VITD: 36.41 ng/mL (ref 30.00–100.00)

## 2021-08-08 IMAGING — DX DG ANKLE COMPLETE 3+V*R*
3 series · 3 of 3 positions shown · non-contrast
Comparison: 05/04/2005

CLINICAL DATA: RIGHT ankle pain, rule out osteomyelitis.

EXAM:
RIGHT ANKLE - COMPLETE 3+ VIEW

[ankle ap]
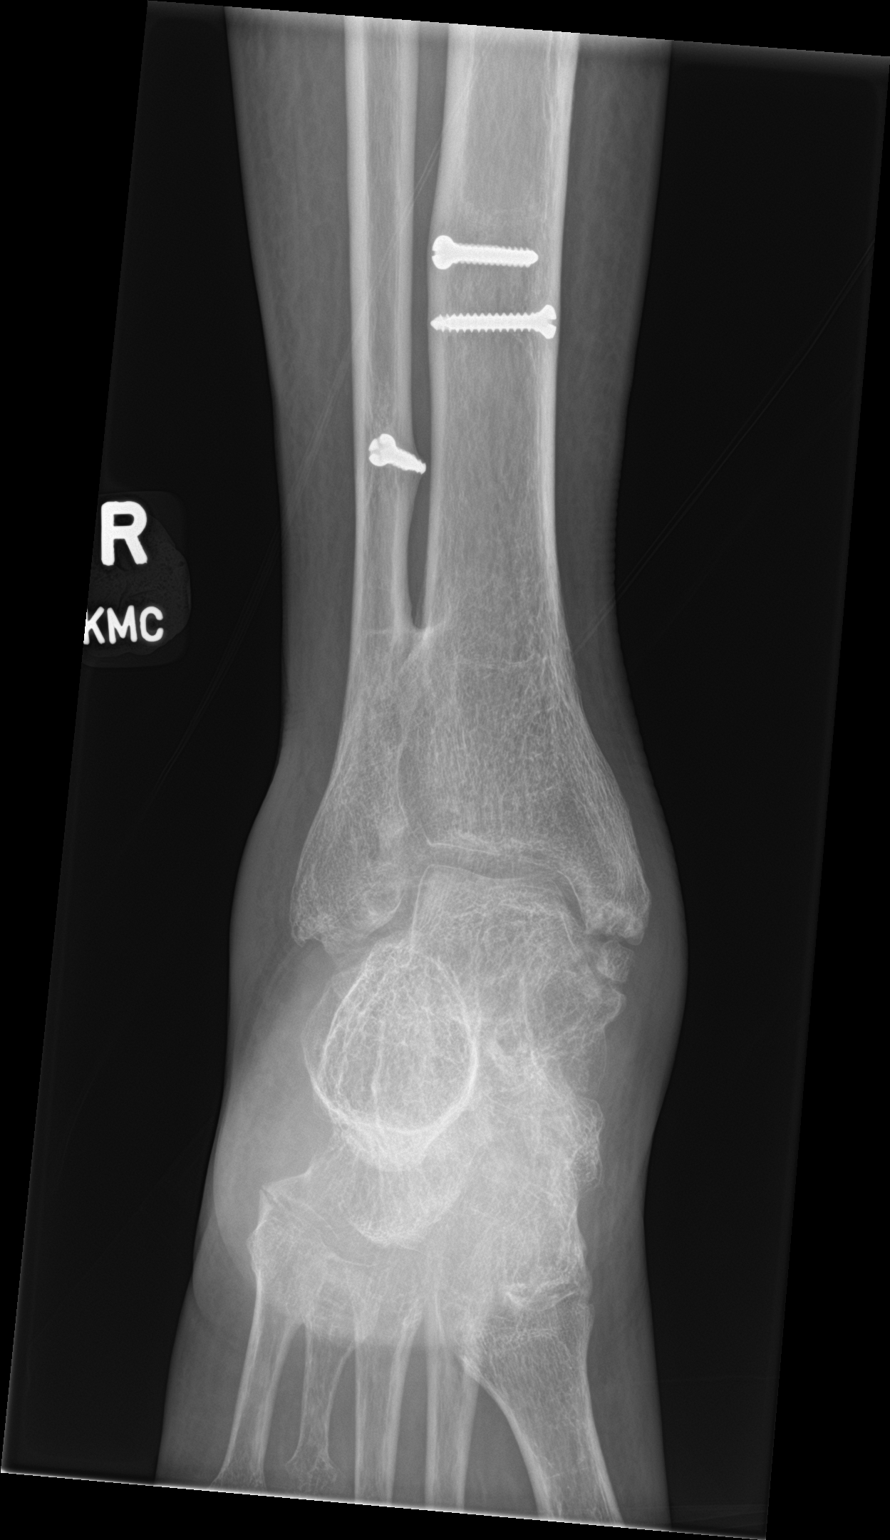

[ankle obl]
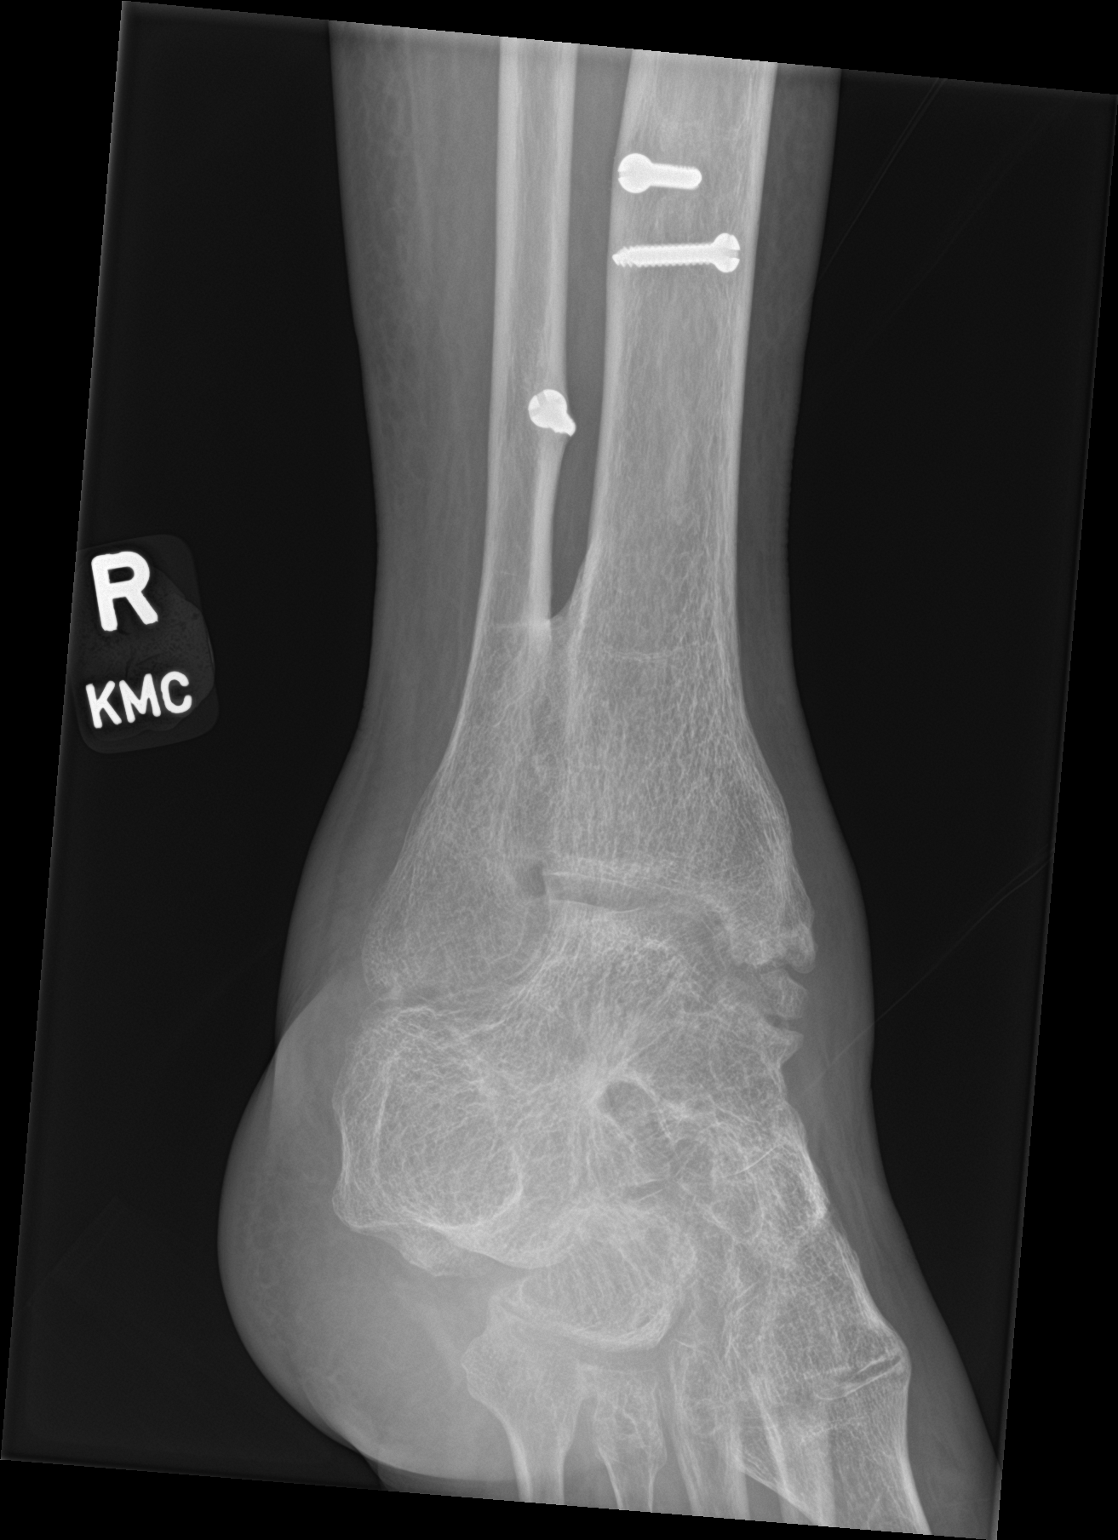

[ankle lat]
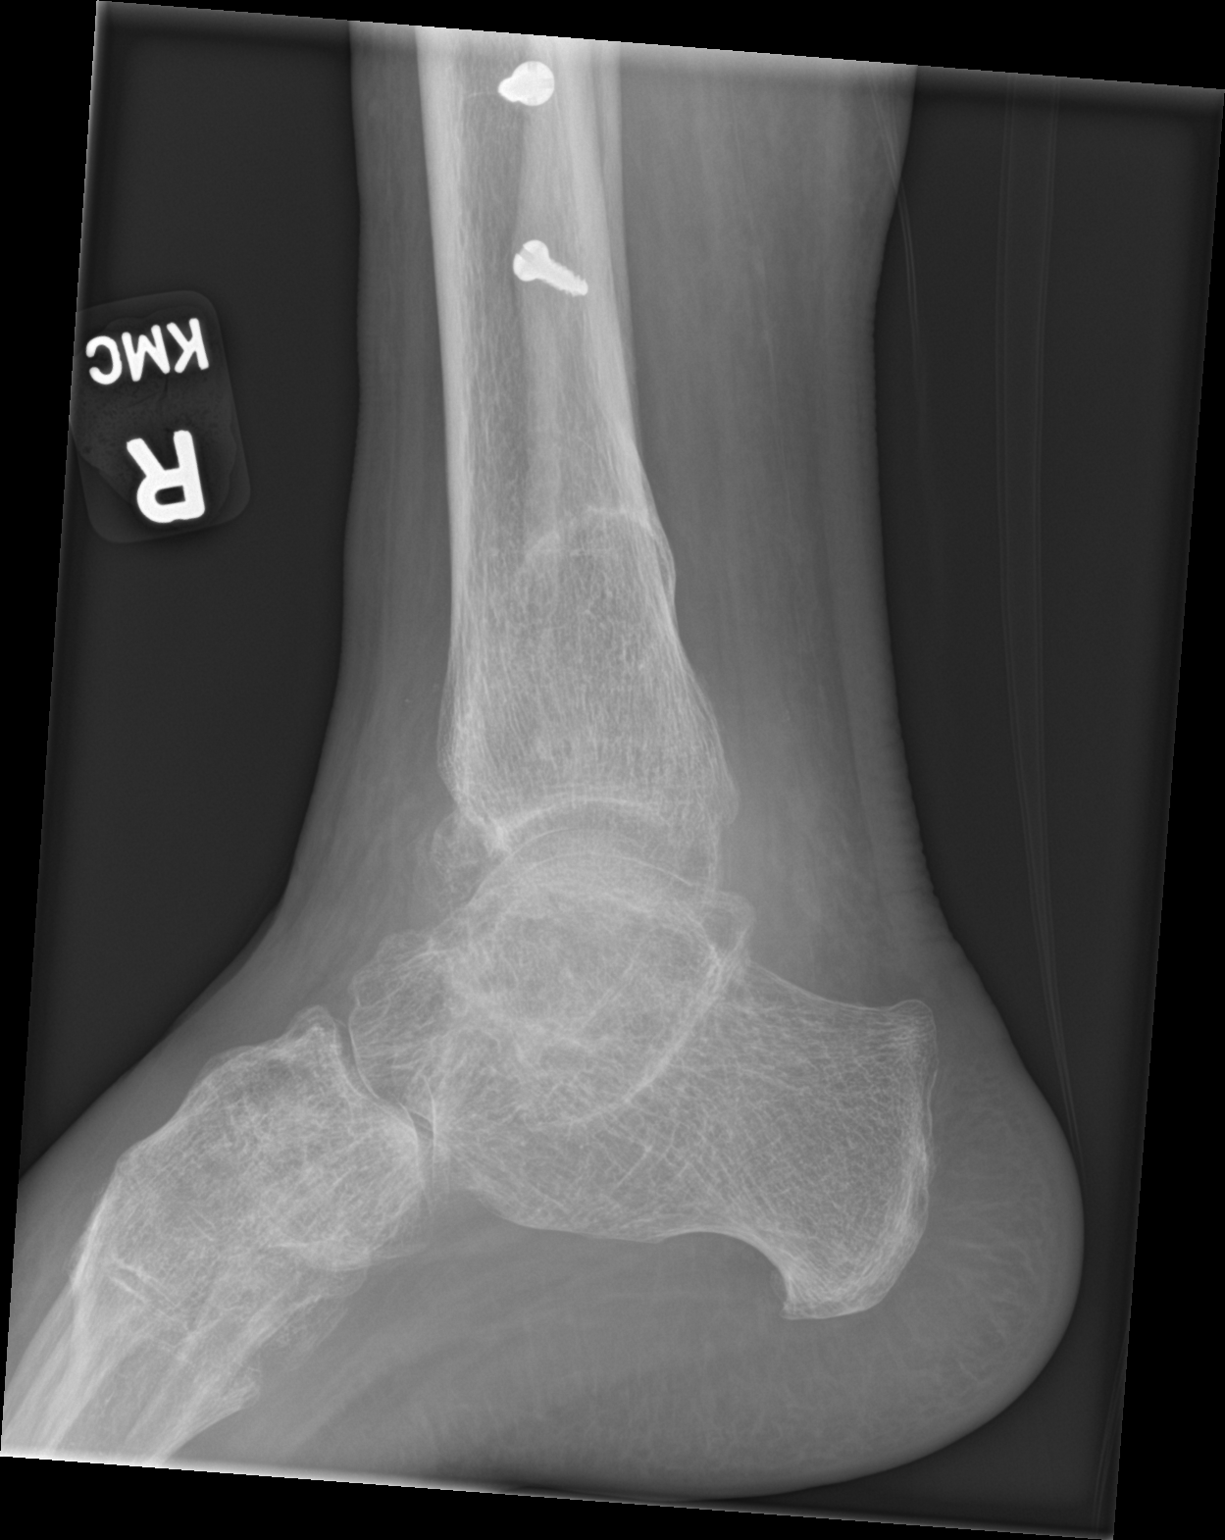

[3 of 3 positions shown; findings below may reference images not displayed]

FINDINGS: Soft tissue swelling about the ankle with very similar appearance
when compared to the study of 7777 bony fusion of the syndesmosis.
Evidence of prior trauma to the medial malleolus with signs of
orthopedic screws within the distal tibia and fibula. No plain film
evidence of bony destruction though potentially with slightly more
effusion about the ankle. Midfoot degenerative changes/bony fusion
similar to previous exam Achilles appears grossly intact.
IMPRESSION: 1. No plain film evidence for osteomyelitis. If there is continued
concern for soft tissue infection and potential bone involvement MRI
or bone scan may be helpful for further assessment.
2. Soft tissue swelling about the ankle with question of slightly
more joint effusion about the ankle.
3. Stable postsurgical changes about the ankle.
4. Midfoot degenerative changes/bony fusion.

## 2021-11-02 ENCOUNTER — Ambulatory Visit: Payer: Medicare PPO | Admitting: Family Medicine

## 2021-12-09 ENCOUNTER — Encounter: Payer: Self-pay | Admitting: Family Medicine

## 2021-12-22 ENCOUNTER — Other Ambulatory Visit: Payer: Self-pay | Admitting: Family Medicine

## 2021-12-22 DIAGNOSIS — M8660 Other chronic osteomyelitis, unspecified site: Secondary | ICD-10-CM

## 2022-01-18 DIAGNOSIS — D649 Anemia, unspecified: Secondary | ICD-10-CM | POA: Diagnosis not present

## 2022-01-18 DIAGNOSIS — M8660 Other chronic osteomyelitis, unspecified site: Secondary | ICD-10-CM | POA: Diagnosis not present

## 2022-01-18 DIAGNOSIS — R7303 Prediabetes: Secondary | ICD-10-CM | POA: Diagnosis not present

## 2022-01-18 DIAGNOSIS — Z5320 Procedure and treatment not carried out because of patient's decision for unspecified reasons: Secondary | ICD-10-CM | POA: Diagnosis not present

## 2022-01-18 DIAGNOSIS — I10 Essential (primary) hypertension: Secondary | ICD-10-CM | POA: Diagnosis not present

## 2022-01-18 DIAGNOSIS — Z7689 Persons encountering health services in other specified circumstances: Secondary | ICD-10-CM | POA: Diagnosis not present

## 2022-01-29 ENCOUNTER — Other Ambulatory Visit: Payer: Self-pay | Admitting: Family Medicine

## 2022-01-29 MED ORDER — AMLODIPINE BESYLATE 2.5 MG PO TABS: ORAL_TABLET | ORAL | 2 refills | Status: AC

## 2022-02-09 DIAGNOSIS — M86452 Chronic osteomyelitis with draining sinus, left femur: Secondary | ICD-10-CM | POA: Diagnosis not present

## 2022-02-09 DIAGNOSIS — M868X5 Other osteomyelitis, thigh: Secondary | ICD-10-CM | POA: Diagnosis not present

## 2022-02-09 DIAGNOSIS — M86652 Other chronic osteomyelitis, left thigh: Secondary | ICD-10-CM | POA: Diagnosis not present

## 2022-02-09 DIAGNOSIS — M86152 Other acute osteomyelitis, left femur: Secondary | ICD-10-CM | POA: Diagnosis not present

## 2022-02-09 DIAGNOSIS — G14 Postpolio syndrome: Secondary | ICD-10-CM | POA: Diagnosis not present

## 2022-02-09 DIAGNOSIS — M85852 Other specified disorders of bone density and structure, left thigh: Secondary | ICD-10-CM | POA: Diagnosis not present

## 2022-02-14 ENCOUNTER — Encounter: Payer: Self-pay | Admitting: Family Medicine

## 2022-03-10 DIAGNOSIS — M86452 Chronic osteomyelitis with draining sinus, left femur: Secondary | ICD-10-CM | POA: Diagnosis not present
